# Patient Record
Sex: Male | Born: 1935 | Race: Black or African American | Hispanic: No | Marital: Married | Smoking: Former smoker
Health system: Southern US, Community
[De-identification: ages and names within clinical notes are randomized; demographics above are authoritative.]

## PROBLEM LIST (undated history)

## (undated) DIAGNOSIS — I10 Essential (primary) hypertension: Secondary | ICD-10-CM

## (undated) DIAGNOSIS — E119 Type 2 diabetes mellitus without complications: Secondary | ICD-10-CM

## (undated) HISTORY — PX: APPENDECTOMY: SHX54

---

## 2009-12-28 ENCOUNTER — Ambulatory Visit: Payer: Self-pay | Admitting: Internal Medicine

## 2011-10-06 IMAGING — US US EXTREM LOW VENOUS*L*
1 series · 18 of 24 positions shown · non-contrast
Comparison: none

REASON FOR EXAM: pain
COMMENTS:

PROCEDURE:     US  - US DOPPLER LOW EXTR LEFT  - December 28, 2009 [DATE]
RESULT:     Left lower extremity color flow duplex Doppler reveals no
evidence of deep venous thrombosis.

[Series 1: us extrem low venous*left* · 18 of 27 slices shown]
[im 1/27]
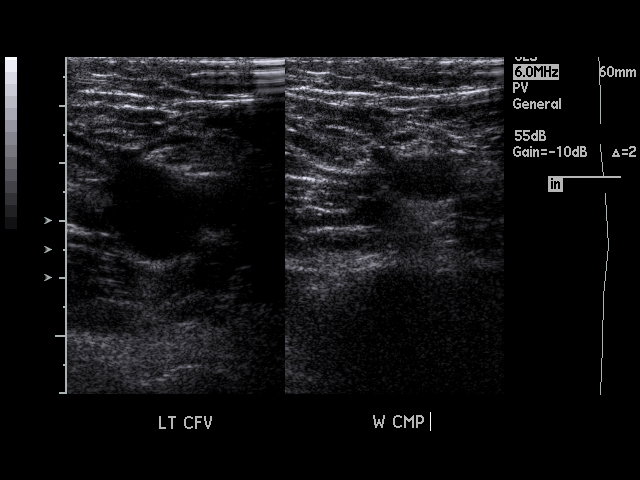
[im 3/27]
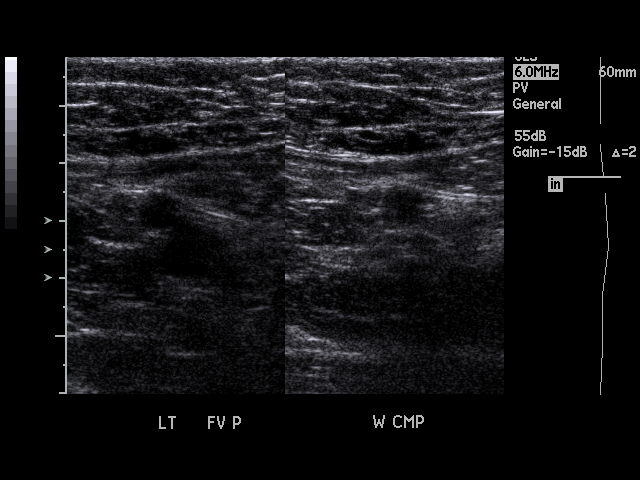
[im 4/27]
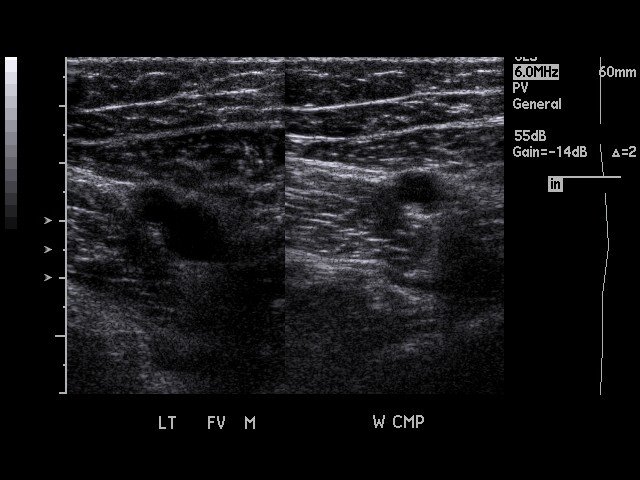
[im 5/27]
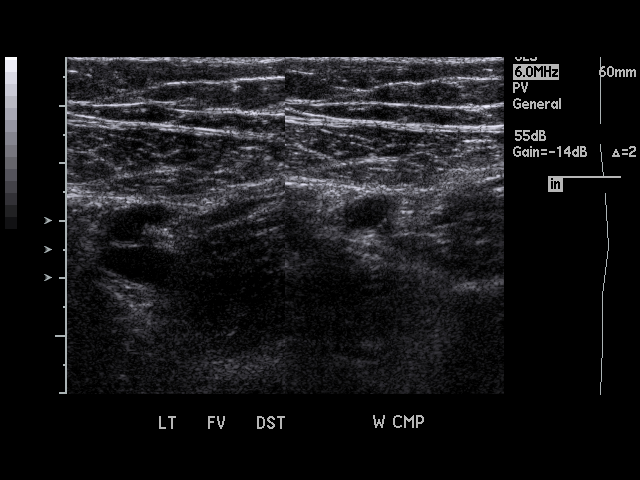
[im 7/27]
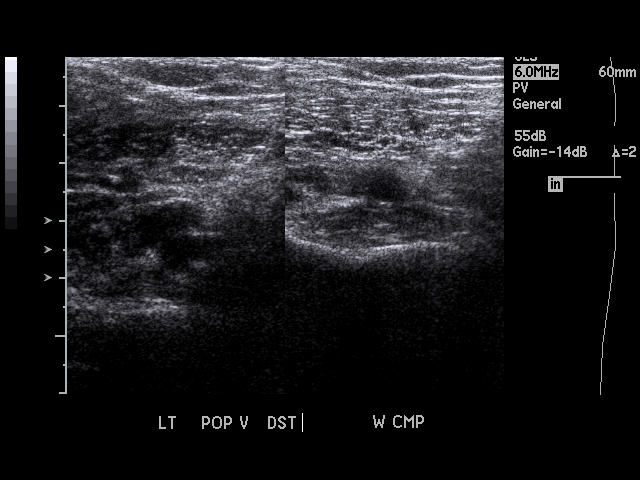
[im 8/27]
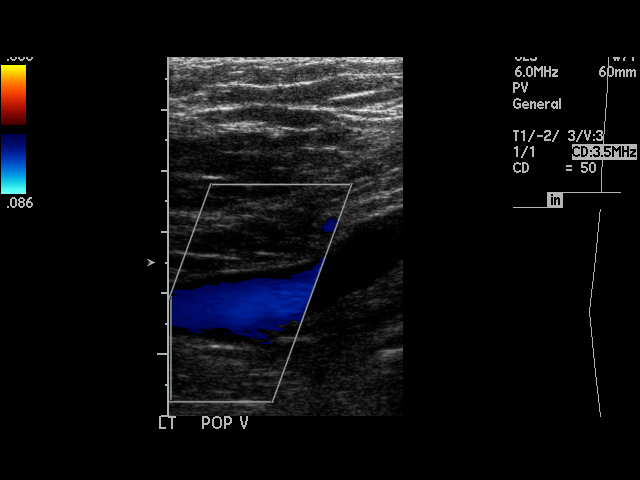
[im 10/27]
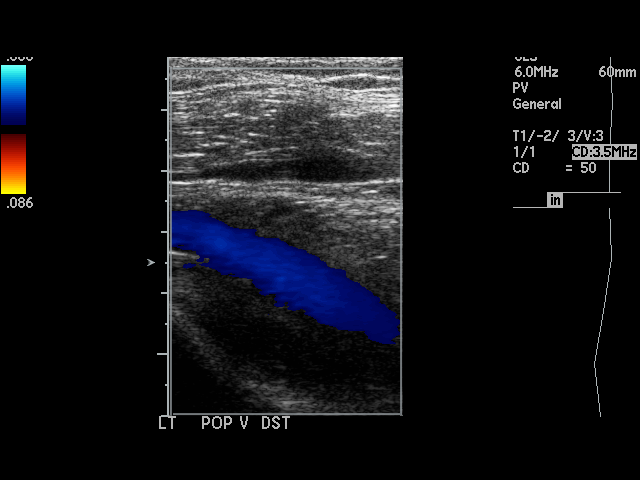
[im 12/27]
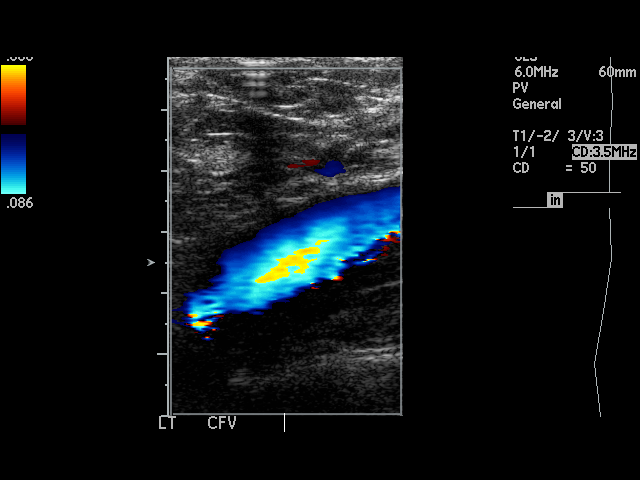
[im 13/27]
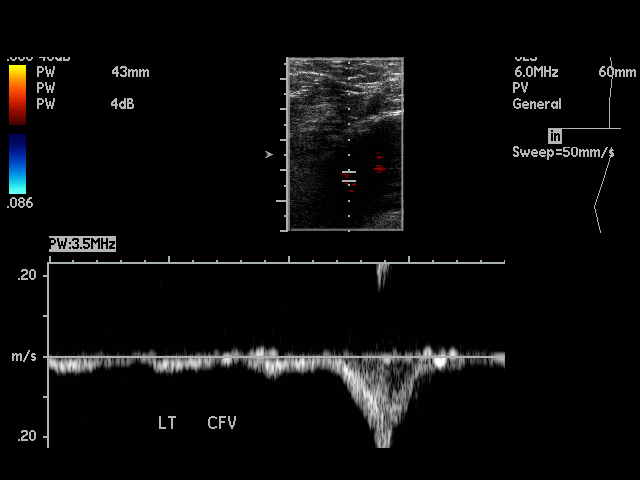
[im 14/27]
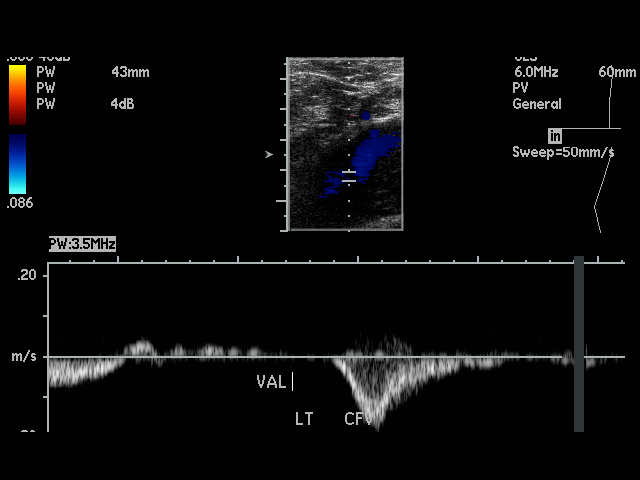
[im 16/27]
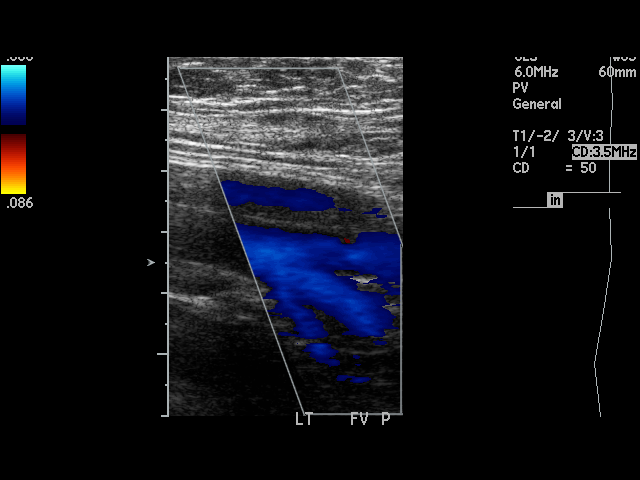
[im 17/27]
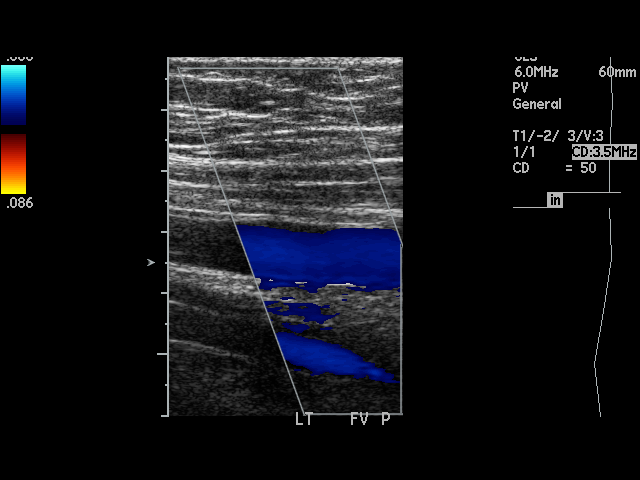
[im 19/27]
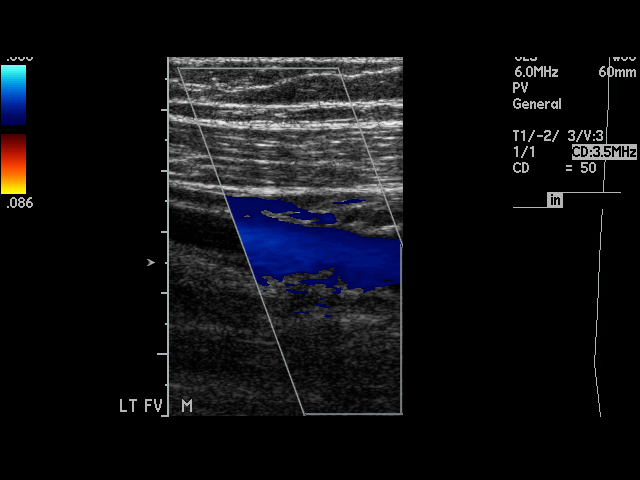
[im 21/27]
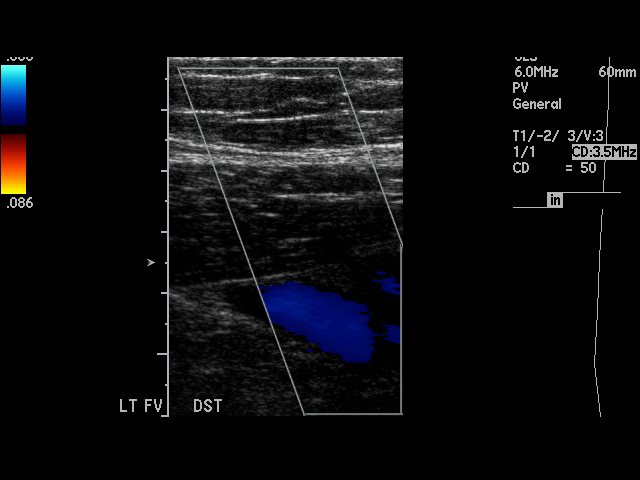
[im 22/27]
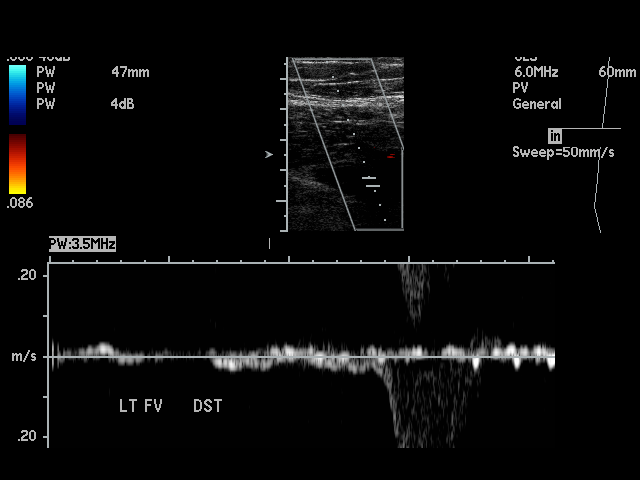
[im 23/27]
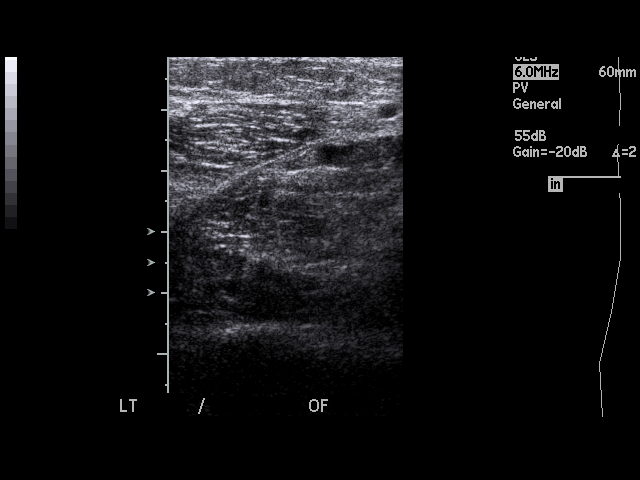
[im 25/27]
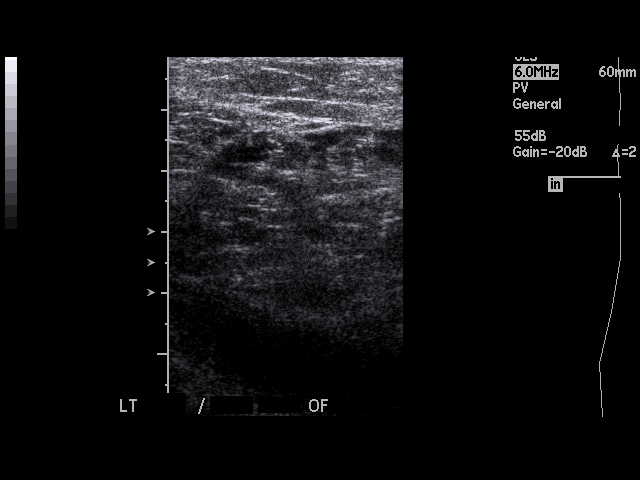
[im 27/27]
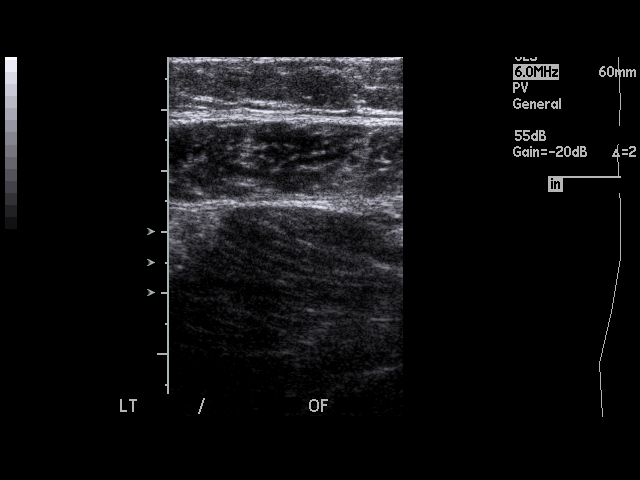

[18 of 24 positions shown; findings below may reference images not displayed]

IMPRESSION: Negative exam.

## 2011-10-14 ENCOUNTER — Ambulatory Visit: Payer: Self-pay | Admitting: Ophthalmology

## 2011-11-18 ENCOUNTER — Ambulatory Visit: Payer: Self-pay | Admitting: Ophthalmology

## 2014-12-01 ENCOUNTER — Emergency Department: Payer: Medicare Other

## 2014-12-01 ENCOUNTER — Encounter: Payer: Self-pay | Admitting: Emergency Medicine

## 2014-12-01 ENCOUNTER — Emergency Department
Admission: EM | Admit: 2014-12-01 | Discharge: 2014-12-01 | Disposition: A | Discharge status: Other Acute Inpatient Hospital | Payer: Medicare Other | Attending: Emergency Medicine | Admitting: Emergency Medicine

## 2014-12-01 DIAGNOSIS — Z7982 Long term (current) use of aspirin: Secondary | ICD-10-CM | POA: Insufficient documentation

## 2014-12-01 DIAGNOSIS — E119 Type 2 diabetes mellitus without complications: Secondary | ICD-10-CM | POA: Insufficient documentation

## 2014-12-01 DIAGNOSIS — Z87891 Personal history of nicotine dependence: Secondary | ICD-10-CM | POA: Diagnosis not present

## 2014-12-01 DIAGNOSIS — I10 Essential (primary) hypertension: Secondary | ICD-10-CM | POA: Insufficient documentation

## 2014-12-01 DIAGNOSIS — G450 Vertebro-basilar artery syndrome: Secondary | ICD-10-CM

## 2014-12-01 DIAGNOSIS — I651 Occlusion and stenosis of basilar artery: Secondary | ICD-10-CM | POA: Diagnosis not present

## 2014-12-01 DIAGNOSIS — Z79899 Other long term (current) drug therapy: Secondary | ICD-10-CM | POA: Diagnosis not present

## 2014-12-01 DIAGNOSIS — R42 Dizziness and giddiness: Secondary | ICD-10-CM | POA: Insufficient documentation

## 2014-12-01 HISTORY — DX: Type 2 diabetes mellitus without complications: E11.9

## 2014-12-01 HISTORY — DX: Essential (primary) hypertension: I10

## 2014-12-01 LAB — URINALYSIS COMPLETE WITH MICROSCOPIC (ARMC ONLY)
BACTERIA UA: NONE SEEN
Bilirubin Urine: NEGATIVE
Glucose, UA: 150 mg/dL — AB
KETONES UR: NEGATIVE mg/dL
LEUKOCYTES UA: NEGATIVE
Nitrite: NEGATIVE
PH: 7 (ref 5.0–8.0)
PROTEIN: NEGATIVE mg/dL
RBC / HPF: NONE SEEN RBC/hpf (ref 0–5)
SQUAMOUS EPITHELIAL / LPF: NONE SEEN
Specific Gravity, Urine: 1.008 (ref 1.005–1.030)

## 2014-12-01 LAB — COMPREHENSIVE METABOLIC PANEL
ALBUMIN: 4.4 g/dL (ref 3.5–5.0)
ALK PHOS: 80 U/L (ref 38–126)
ALT: 18 U/L (ref 17–63)
AST: 20 U/L (ref 15–41)
Anion gap: 8 (ref 5–15)
BUN: 11 mg/dL (ref 6–20)
CALCIUM: 9.2 mg/dL (ref 8.9–10.3)
CO2: 26 mmol/L (ref 22–32)
CREATININE: 1.04 mg/dL (ref 0.61–1.24)
Chloride: 106 mmol/L (ref 101–111)
GFR calc Af Amer: >60 mL/min (ref >60)
GFR calc non Af Amer: >60 mL/min (ref >60)
GLUCOSE: 183 mg/dL — AB (ref 65–99)
Potassium: 3.9 mmol/L (ref 3.5–5.1)
SODIUM: 140 mmol/L (ref 135–145)
Total Bilirubin: 0.5 mg/dL (ref 0.3–1.2)
Total Protein: 8.3 g/dL — ABNORMAL HIGH (ref 6.5–8.1)

## 2014-12-01 LAB — CBC WITH DIFFERENTIAL/PLATELET
BASOS PCT: 2 %
Basophils Absolute: 0.1 10*3/uL (ref 0–0.1)
EOS ABS: 0.1 10*3/uL (ref 0–0.7)
Eosinophils Relative: 3 %
HCT: 41.2 % (ref 40.0–52.0)
HEMOGLOBIN: 13.3 g/dL (ref 13.0–18.0)
LYMPHS ABS: 1.7 10*3/uL (ref 1.0–3.6)
Lymphocytes Relative: 39 %
MCH: 27.8 pg (ref 26.0–34.0)
MCHC: 32.4 g/dL (ref 32.0–36.0)
MCV: 85.8 fL (ref 80.0–100.0)
Monocytes Absolute: 0.6 10*3/uL (ref 0.2–1.0)
Monocytes Relative: 13 %
NEUTROS PCT: 43 %
Neutro Abs: 1.9 10*3/uL (ref 1.4–6.5)
Platelets: 215 10*3/uL (ref 150–440)
RBC: 4.8 MIL/uL (ref 4.40–5.90)
RDW: 12.9 % (ref 11.5–14.5)
WBC: 4.4 10*3/uL (ref 3.8–10.6)

## 2014-12-01 LAB — PROTIME-INR
INR: 0.95
Prothrombin Time: 12.9 seconds (ref 11.4–15.0)

## 2014-12-01 LAB — TROPONIN I: Troponin I: <0.03 ng/mL (ref <0.031)

## 2014-12-01 LAB — APTT: APTT: 30 s (ref 24–36)

## 2014-12-01 MED ORDER — MECLIZINE HCL 25 MG PO TABS
25.0000 mg | ORAL_TABLET | Freq: Once | ORAL | Status: AC
Start: 2014-12-01 — End: 2014-12-01
  Administered 2014-12-01: 25 mg via ORAL
  Filled 2014-12-01: qty 1

## 2014-12-01 MED ORDER — CLOPIDOGREL BISULFATE 75 MG PO TABS
75.0000 mg | ORAL_TABLET | ORAL | Status: AC
  Administered 2014-12-01: 75 mg via ORAL
  Filled 2014-12-01: qty 1

## 2014-12-01 NOTE — Consult Note (Addendum)
CC: vertigo  HPI: Jonathan Christensen is an 81 y.o. male with a history of diabetes and hypertension but with no prior history of CVA/TIA or CAD/ACS who presents with acute onset dizziness this morning. He states he awoke at the usual time early this morning and felt extremely dizzy with the sensation of the room spinning. When he is at rest it is better but when he sits up or tries to move it becomes severe.  There is no nystagmus or tinnitus.  MRI no acute abnormality. MRA significant intracranial stenosis ant and posterior circulation specifically the basilar.  Was on ASA 81 at home daily.     Past Medical History  Diagnosis Date  . Hypertension   . Diabetes mellitus without complication (HCC)     type 2    Past Surgical History  Procedure Laterality Date  . Appendectomy      No family history on file.  Social History:  reports that he has quit smoking. He does not have any smokeless tobacco history on file. He reports that he drinks alcohol. His drug history is not on file.  No Known Allergies  Medications: I have reviewed the patient's current medications.  ROS: History obtained from the patient  General ROS: negative for - chills, fatigue, fever, night sweats, weight gain or weight loss Psychological ROS: negative for - behavioral disorder, hallucinations, memory difficulties, mood swings or suicidal ideation Ophthalmic ROS: negative for - blurry vision, double vision, eye pain or loss of vision ENT ROS: negative for - epistaxis, nasal discharge, oral lesions, sore throat, tinnitus or vertigo Allergy and Immunology ROS: negative for - hives or itchy/watery eyes Hematological and Lymphatic ROS: negative for - bleeding problems, bruising or swollen lymph nodes Endocrine ROS: negative for - galactorrhea, hair pattern changes, polydipsia/polyuria or temperature intolerance Respiratory ROS: negative for - cough, hemoptysis, shortness of breath or wheezing Cardiovascular ROS:  negative for - chest pain, dyspnea on exertion, edema or irregular heartbeat Gastrointestinal ROS: negative for - abdominal pain, diarrhea, hematemesis, nausea/vomiting or stool incontinence Genito-Urinary ROS: negative for - dysuria, hematuria, incontinence or urinary frequency/urgency Musculoskeletal ROS: negative for - joint swelling or muscular weakness Neurological ROS: as noted in HPI Dermatological ROS: negative for rash and skin lesion changes  Physical Examination: Blood pressure 149/65, pulse 75, temperature 98.2 F (36.8 C), temperature source Oral, resp. rate 13, height  (1.702 m), weight 192 lb (87.091 kg), SpO2 97 %.    Neurological Examination Mental Status: Alert, oriented, thought content appropriate.  Speech fluent without evidence of aphasia.  Able to follow 3 step commands without difficulty. Cranial Nerves: II: Discs flat bilaterally; Visual fields grossly normal, pupils equal, round, reactive to light and accommodation III,IV, VI: ptosis not present, extra-ocular motions intact bilaterally V,VII: smile symmetric, facial light touch sensation normal bilaterally VIII: hearing normal bilaterally IX,X: gag reflex present XI: bilateral shoulder shrug XII: midline tongue extension Motor: Right : Upper extremity   5/5    Left:     Upper extremity   5/5  Lower extremity   5/5     Lower extremity   5/5 Tone and bulk:normal tone throughout; no atrophy noted Sensory: Pinprick and light touch intact throughout, bilaterally Deep Tendon Reflexes: 1+ and symmetric throughout Plantars: Right: downgoing   Left: downgoing Cerebellar: normal finger-to-nose, normal rapid alternating movements and normal heel-to-shin test Gait: not tested.       Laboratory Studies:   Basic Metabolic Panel:  Recent Labs Lab 12/01/14 0723  NA 140  K 3.9  CL 106  CO2 26  GLUCOSE 183*  BUN 11  CREATININE 1.04  CALCIUM 9.2    Liver Function Tests:  Recent Labs Lab  12/01/14 0723  AST 20  ALT 18  ALKPHOS 80  BILITOT 0.5  PROT 8.3*  ALBUMIN 4.4   No results for input(s): LIPASE, AMYLASE in the last 168 hours. No results for input(s): AMMONIA in the last 168 hours.  CBC:  Recent Labs Lab 12/01/14 0723  WBC 4.4  NEUTROABS 1.9  HGB 13.3  HCT 41.2  MCV 85.8  PLT 215    Cardiac Enzymes:  Recent Labs Lab 12/01/14 0723  TROPONINI <0.03    BNP: Invalid input(s): POCBNP  CBG: No results for input(s): GLUCAP in the last 168 hours.  Microbiology: No results found for this or any previous visit.  Coagulation Studies:  Recent Labs  12/01/14 0723  LABPROT 12.9  INR 0.95    Urinalysis:  Recent Labs Lab 12/01/14 1152  COLORURINE STRAW*  LABSPEC 1.008  PHURINE 7.0  GLUCOSEU 150*  HGBUR 1+*  BILIRUBINUR NEGATIVE  KETONESUR NEGATIVE  PROTEINUR NEGATIVE  NITRITE NEGATIVE  LEUKOCYTESUR NEGATIVE    Lipid Panel:  No results found for: CHOL, TRIG, HDL, CHOLHDL, VLDL, LDLCALC  HgbA1C: No results found for: HGBA1C  Urine Drug Screen:  No results found for: LABOPIA, COCAINSCRNUR, LABBENZ, AMPHETMU, THCU, LABBARB  Alcohol Level: No results for input(s): ETH in the last 168 hours.  Other results: EKG: normal EKG, normal sinus rhythm, unchanged from previous tracings.  Imaging: Mr Angiogram Head Wo Contrast  12/01/2014  CLINICAL DATA:  Patient woke up today feeling dizzy. History of hypertension. History of diabetes. EXAM: MRI HEAD WITHOUT CONTRAST MRA HEAD WITHOUT CONTRAST TECHNIQUE: Multiplanar, multiecho pulse sequences of the brain and surrounding structures were obtained without intravenous contrast. Angiographic images of the head were obtained using MRA technique without contrast. COMPARISON:  None. FINDINGS: MRI HEAD FINDINGS No acute stroke is evident. There is no visible hemorrhage, mass lesion, or extra-axial fluid. Generalized atrophy with hydrocephalus ex vacuo. Minor white matter disease, nonspecific. Flow voids  are maintained in the BILATERAL internal carotid arteries, basilar artery, and RIGHT vertebral artery. 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 This is of indeterminate age. I am unable to assess whether this is an acute finding related to the patient's acute vertigo, or is chronically occluded. Pituitary and cerebellar tonsils unremarkable. Mild spondylosis. No acute sinus or mastoid disease. Visualized orbits are negative. MRA HEAD FINDINGS RIGHT internal carotid artery: Widely patent cervical and petrous segments. 75% stenosis inferior cavernous segment. Superior cavernous, supraclinoid and ICA terminus all widely patent. LEFT internal carotid artery: Non stenotic atheromatous change at the cavernous supraclinoid junction. Otherwise widely patent. RIGHT vertebral: Widely patent. LEFT vertebral:  Thrombosed. Basilar: Severely diseased basilar, moderately irregular in its proximal, mid, and distal segments with a focal mid basilar 75% stenosis, potentially flow reducing. Anterior circulation: Severely diseased A1 origin on the RIGHT. Functionally occluded LEFT A1 ACA. Both anterior cerebral arteries fill from the RIGHT but are at risk due to the high-grade proximal stenosis. Mildly irregular RIGHT and LEFT M1 MCA. Severely diseased inferior branch LEFT M2 MCA. Mildly irregular distal M3 branches bilaterally. Posterior circulation: Severely diseased RIGHT and LEFT P1 segments of the posterior cerebral arteries, estimated 75-90% stenoses. Unremarkable BILATERAL superior cerebellar arteries. Dominant RIGHT anterior inferior cerebral artery, likely AICA/PICA trunk. With No similar findings on the LEFT. No intracranial aneurysm. IMPRESSION: No acute stroke. Generalized atrophy. 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 Potentially flow reducing  stenoses of the RIGHT internal carotid artery, RIGHT anterior cerebral artery, functionally occluded LEFT A1 ACA, both posterior cerebral arteries, and LEFT M2 MCA. Electronically Signed   By: Elsie StainJohn T Curnes M.D.   On: 12/01/2014 10:46   Mr Brain Wo Contrast  12/01/2014  CLINICAL DATA:  Patient woke up today feeling dizzy. History of hypertension. History of diabetes. EXAM: MRI HEAD WITHOUT CONTRAST MRA HEAD WITHOUT CONTRAST TECHNIQUE: Multiplanar, multiecho pulse sequences of the brain and surrounding structures were obtained without intravenous contrast. Angiographic images of the head were obtained using MRA technique without contrast. COMPARISON:  None. FINDINGS: MRI HEAD FINDINGS No acute stroke is evident. There is no visible hemorrhage, mass lesion, or extra-axial fluid. Generalized atrophy with hydrocephalus ex vacuo. Minor white matter disease, nonspecific. Flow voids are maintained in the BILATERAL internal carotid arteries, basilar artery, and RIGHT vertebral artery. 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 This is of indeterminate age. I am unable to assess whether this is an acute finding related to the patient's acute vertigo, or is chronically occluded. Pituitary and cerebellar tonsils unremarkable. Mild spondylosis. No acute sinus or mastoid disease. Visualized orbits are negative. MRA HEAD FINDINGS RIGHT internal carotid artery: Widely patent cervical and petrous segments. 75% stenosis inferior cavernous segment. Superior cavernous, supraclinoid and ICA terminus all widely patent. LEFT internal carotid artery: Non stenotic atheromatous change at the cavernous supraclinoid junction. Otherwise widely patent. RIGHT vertebral: Widely patent. LEFT vertebral:  Thrombosed. Basilar: Severely diseased basilar, moderately irregular in its proximal, mid, and distal segments with a focal mid basilar 75% stenosis, potentially flow reducing. Anterior circulation: Severely diseased A1 origin on the RIGHT. Functionally  occluded LEFT A1 ACA. Both anterior cerebral arteries fill from the RIGHT but are at risk due to the high-grade proximal stenosis. Mildly irregular RIGHT and LEFT M1 MCA. Severely diseased inferior branch LEFT M2 MCA. Mildly irregular distal M3 branches bilaterally. Posterior circulation: Severely diseased RIGHT and LEFT P1 segments of the posterior cerebral arteries, estimated 75-90% stenoses. Unremarkable BILATERAL superior cerebellar arteries. Dominant RIGHT anterior inferior cerebral artery, likely AICA/PICA trunk. With No similar findings on the LEFT. No intracranial aneurysm. IMPRESSION: No acute stroke. Generalized atrophy. 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 Potentially flow reducing stenoses of the RIGHT internal carotid artery, RIGHT anterior cerebral artery, functionally occluded LEFT A1 ACA, both posterior cerebral arteries, and LEFT M2 MCA. Electronically Signed   By: Elsie StainJohn T Curnes M.D.   On: 12/01/2014 10:46     Assessment/Plan:  81 y.o. male with a history of diabetes and hypertension but with no prior history of CVA/TIA or CAD/ACS who presents with acute onset dizziness this morning. He states he awoke at the usual time early this morning and felt extremely dizzy with the sensation of the room spinning. When he is at rest it is better but when he sits up or tries to move it becomes severe.  There is no nystagmus or tinnitus.  MRI no acute abnormality. MRA significant intracranial stenosis ant and posterior circulation specifically the basilar.  Was on ASA 81 at home daily.      - [facility] - Dr. Lanna PocheHeck to follow up  on the patient - started on plavix 75, he is on dual anti platelet therapy now - Keep SBP >140 - Transfer center called in regard to the transfer   12/01/2014, 2:06 PM

## 2014-12-01 NOTE — ED Notes (Signed)
Pt states he woke up this am with feeling dizzy like the room was spinning, was able to walk with EMS into ambulance with no problems. Alert and oriented. NIH 0.

## 2014-12-01 NOTE — ED Provider Notes (Signed)
[facility] Emergency Department Provider Note  ____________________________________________  Time seen: Approximately 7:24 AM  I have reviewed the triage vital signs and the nursing notes.   HISTORY  Chief Complaint Dizziness    HPI Jonathan Christensen is a 81 y.o. male with a history of diabetes and hypertension but with no prior history of CVA/TIA or CAD/ACS who presents with acute onset dizziness this morning.  He states he awoke at the usual time early this morning and felt extremely dizzy with the sensation of the room spinning.  When he is at rest it is better but when he sits up or tries to move it becomes severe.  He waited a period of time to see if it would resolve but it did not so he called EMS.  EMS notes that he was ambulatory to the ambulance without any difficulties.  He denies nausea or vomiting and states he is starving.  He has no pain.  He complains of no numbness or weakness in any of his extremities.  He feels mildly vertiginous at rest in bed but states it become severe when he sits up.   Past Medical History  Diagnosis Date  . Hypertension   . Diabetes mellitus without complication (HCC)     type 2    There are no active problems to display for this patient.   Past Surgical History  Procedure Laterality Date  . Appendectomy      Current Outpatient Rx  Name  Route  Sig  Dispense  Refill  . amLODipine-benazepril (LOTREL) 10-20 MG capsule   Oral   Take 1 capsule by mouth daily.         Marland Kitchen. aspirin EC 81 MG tablet   Oral   Take 81 mg by mouth daily.         Marland Kitchen. atorvastatin (LIPITOR) 10 MG tablet   Oral   Take 10 mg by mouth every morning.         . metFORMIN (GLUCOPHAGE) 500 MG tablet   Oral   Take 500 mg by mouth daily. Take with food.         . metoprolol succinate (TOPROL-XL) 100 MG 24 hr tablet   Oral   Take 100 mg by mouth daily.         . mometasone (NASONEX) 50 MCG/ACT nasal spray   Nasal   Place 2 sprays  into the nose daily as needed. For allergies.           Allergies Review of patient's allergies indicates no known allergies.  No family history on file.  Social History Social History  Substance Use Topics  . Smoking status: Former Games developermoker  . Smokeless tobacco: None  . Alcohol Use: Yes     Comment: occasionally    Review of Systems Constitutional: No fever/chills Eyes: No visual changes. ENT: No sore throat. Cardiovascular: Denies chest pain. Respiratory: Denies shortness of breath. Gastrointestinal: No abdominal pain.  No nausea, no vomiting.  No diarrhea.  No constipation. Genitourinary: Negative for dysuria. Musculoskeletal: Negative for back pain. Skin: Negative for rash. Neurological: Negative for headaches, focal weakness or numbness.  Vertigo upon change of position.  10-point ROS otherwise negative.  ____________________________________________   PHYSICAL EXAM:  ED Triage Vitals  Enc Vitals Group     BP 12/01/14 0727 170/81 mmHg     Pulse Rate 12/01/14 0727 74     Resp 12/01/14 0727 16     Temp 12/01/14 0727 98.2 F (36.8 C)  Temp Source 12/01/14 0727 Oral     SpO2 12/01/14 0727 97 %     Weight 12/01/14 0727 192 lb (87.091 kg)     Height 12/01/14 0727  (1.702 m)     Head Cir --      Peak Flow --      Pain Score --      Pain Loc --      Pain Edu? --      Excl. in GC? --       Constitutional: Alert and oriented. Well appearing and in no acute distress. Eyes: Conjunctivae are normal. PERRL. EOMI. slow beating horizontal nystagmus at the extremes of visual fields, more notable when looking to the far right. Head: Atraumatic.  Ear canals are clean, nonerythematous, and TMs are normal in appearance with no evidence of effusions. Nose: No congestion/rhinnorhea. Mouth/Throat: Mucous membranes are moist.  Oropharynx non-erythematous. Neck: No stridor.   Cardiovascular: Normal rate, regular rhythm. Grossly normal heart sounds.  Good peripheral  circulation. Respiratory: Normal respiratory effort.  No retractions. Lungs CTAB. Gastrointestinal: Soft and nontender. No distention. No abdominal bruits. No CVA tenderness. Musculoskeletal: No lower extremity tenderness nor edema.  No joint effusions. Neurologic:  Normal speech and language. No gross focal neurologic deficits are appreciated.  Romberg is negative.  There is no dysmetria on finger to nose test.  Strength is intact throughout extremities as is sensation.  There is no pronator drift. Skin:  Skin is warm, dry and intact. No rash noted. Psychiatric: Mood and affect are normal. Speech and behavior are normal.  ____________________________________________   LABS (all labs ordered are listed, but only abnormal results are displayed)  Labs Reviewed  COMPREHENSIVE METABOLIC PANEL - Abnormal; Notable for the following:    Glucose, Bld 183 (*)    Total Protein 8.3 (*)    All other components within normal limits  URINALYSIS COMPLETEWITH MICROSCOPIC ([facility] ONLY) - Abnormal; Notable for the following:    Color, Urine STRAW (*)    APPearance CLEAR (*)    Glucose, UA 150 (*)    Hgb urine dipstick 1+ (*)    All other components within normal limits  CBC WITH DIFFERENTIAL/PLATELET  PROTIME-INR  APTT  TROPONIN I   ____________________________________________  EKG  ED ECG REPORT I, Marshall Roehrich, the attending physician, personally viewed and interpreted this ECG.  Date: 12/01/2014 EKG Time: 07:20 Rate: 78 Rhythm: normal sinus rhythm QRS Axis: normal Intervals: normal ST/T Wave abnormalities: normal Conduction Disutrbances: none Narrative Interpretation: unremarkable  ____________________________________________  RADIOLOGY   Mr Angiogram Head Wo Contrast  12/01/2014  CLINICAL DATA:  Patient woke up today feeling dizzy. History of hypertension. History of diabetes. EXAM: MRI HEAD WITHOUT CONTRAST MRA HEAD WITHOUT CONTRAST TECHNIQUE: Multiplanar, multiecho pulse  sequences of the brain and surrounding structures were obtained without intravenous contrast. Angiographic images of the head were obtained using MRA technique without contrast. COMPARISON:  None. FINDINGS: MRI HEAD FINDINGS No acute stroke is evident. There is no visible hemorrhage, mass lesion, or extra-axial fluid. Generalized atrophy with hydrocephalus ex vacuo. Minor white matter disease, nonspecific. Flow voids are maintained in the BILATERAL internal carotid arteries, basilar artery, and RIGHT vertebral artery. 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 This is of indeterminate age. I am unable to assess whether this is an acute finding related to the patient's acute vertigo, or is chronically occluded. Pituitary and cerebellar tonsils unremarkable. Mild spondylosis. No acute sinus or mastoid disease. Visualized orbits are negative. MRA HEAD FINDINGS RIGHT internal carotid  artery: Widely patent cervical and petrous segments. 75% stenosis inferior cavernous segment. Superior cavernous, supraclinoid and ICA terminus all widely patent. LEFT internal carotid artery: Non stenotic atheromatous change at the cavernous supraclinoid junction. Otherwise widely patent. RIGHT vertebral: Widely patent. LEFT vertebral:  Thrombosed. Basilar: Severely diseased basilar, moderately irregular in its proximal, mid, and distal segments with a focal mid basilar 75% stenosis, potentially flow reducing. Anterior circulation: Severely diseased A1 origin on the RIGHT. Functionally occluded LEFT A1 ACA. Both anterior cerebral arteries fill from the RIGHT but are at risk due to the high-grade proximal stenosis. Mildly irregular RIGHT and LEFT M1 MCA. Severely diseased inferior branch LEFT M2 MCA. Mildly irregular distal M3 branches bilaterally. Posterior circulation: Severely diseased RIGHT and LEFT P1 segments of the posterior cerebral arteries, estimated 75-90% stenoses. Unremarkable BILATERAL superior cerebellar arteries. Dominant RIGHT  anterior inferior cerebral artery, likely AICA/PICA trunk. With No similar findings on the LEFT. No intracranial aneurysm. IMPRESSION: No acute stroke. Generalized atrophy. 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 Potentially flow reducing stenoses of the RIGHT internal carotid artery, RIGHT anterior cerebral artery, functionally occluded LEFT A1 ACA, both posterior cerebral arteries, and LEFT M2 MCA. Electronically Signed   By: Elsie Stain M.D.   On: 12/01/2014 10:46   Mr Brain Wo Contrast  12/01/2014  CLINICAL DATA:  Patient woke up today feeling dizzy. History of hypertension. History of diabetes. EXAM: MRI HEAD WITHOUT CONTRAST MRA HEAD WITHOUT CONTRAST TECHNIQUE: Multiplanar, multiecho pulse sequences of the brain and surrounding structures were obtained without intravenous contrast. Angiographic images of the head were obtained using MRA technique without contrast. COMPARISON:  None. FINDINGS: MRI HEAD FINDINGS No acute stroke is evident. There is no visible hemorrhage, mass lesion, or extra-axial fluid. Generalized atrophy with hydrocephalus ex vacuo. Minor white matter disease, nonspecific. Flow voids are maintained in the BILATERAL internal carotid arteries, basilar artery, and RIGHT vertebral artery. 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 This is of indeterminate age. I am unable to assess whether this is an acute finding related to the patient's acute vertigo, or is chronically occluded. Pituitary and cerebellar tonsils unremarkable. Mild spondylosis. No acute sinus or mastoid disease. Visualized orbits are negative. MRA HEAD FINDINGS RIGHT internal carotid artery: Widely patent cervical and petrous segments. 75% stenosis inferior cavernous segment. Superior cavernous, supraclinoid and ICA terminus all widely patent.  LEFT internal carotid artery: Non stenotic atheromatous change at the cavernous supraclinoid junction. Otherwise widely patent. RIGHT vertebral: Widely patent. LEFT vertebral:  Thrombosed. Basilar: Severely diseased basilar, moderately irregular in its proximal, mid, and distal segments with a focal mid basilar 75% stenosis, potentially flow reducing. Anterior circulation: Severely diseased A1 origin on the RIGHT. Functionally occluded LEFT A1 ACA. Both anterior cerebral arteries fill from the RIGHT but are at risk due to the high-grade proximal stenosis. Mildly irregular RIGHT and LEFT M1 MCA. Severely diseased inferior branch LEFT M2 MCA. Mildly irregular distal M3 branches bilaterally. Posterior circulation: Severely diseased RIGHT and LEFT P1 segments of the posterior cerebral arteries, estimated 75-90% stenoses. Unremarkable BILATERAL superior cerebellar arteries. Dominant RIGHT anterior inferior cerebral artery, likely AICA/PICA trunk. With No similar findings on the LEFT. No intracranial aneurysm. IMPRESSION: No acute stroke. Generalized atrophy. 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 Severely diseased basilar artery with potentially flow  reducing 75% mid basilar stenosis. Potentially flow reducing stenoses of the RIGHT internal carotid artery, RIGHT anterior cerebral artery, functionally occluded LEFT A1 ACA, both posterior cerebral arteries, and LEFT M2 MCA. Electronically Signed   By: Elsie Stain M.D.   On: 12/01/2014 10:46    ____________________________________________   PROCEDURES  Procedure(s) performed: None  Critical Care performed: No ____________________________________________   INITIAL IMPRESSION / ASSESSMENT AND PLAN / ED COURSE  Pertinent labs & imaging results that were available during my care of the patient were reviewed by me and considered in my medical decision making (see  chart for details).  Considering central versus peripheral vertigo.  Overall the patient is very well-appearing with relatively mild symptoms.  Ironically that may be more consistent with a central cause rather than the dramatic vertigo and vomiting seen typically with peripheral etiology.  I will evaluate further, empirically treated with meclizine, and consider imaging  ----------------------------------------- 8:52 AM on 12/01/2014 -----------------------------------------  The patient's symptoms did not improve with meclizine.  Given his risk factors, the acute onset of his symptoms, and my concern for a central cause, I will further evaluate with MRI head without contrast and MRA brain without contrast.  I verified that these are the appropriate and sufficient studies to order by calling and speaking by phone with the radiologist on-call at [facility].  The patient and his wife agree with this plan.  (Note that documentation was delayed due to multiple ED patients requiring immediate care.)   ----------------------------------------- 1:59 PM on 12/01/2014 -----------------------------------------  I spoke with phone with the on-call vascular surgeon, Dr. Evie Lacks, who confirmed that they do not perform this type of procedure.  I spoke again with Dr. Loretha Brasil who had time to consider the patient's symptoms and the extent of disease on his MRI.  He is very concerned about the patient and his possibility of having a large cerebellar CVA.  Dr. Loretha Brasil came to [facility] and evaluated the patient in person in the emergency department.  He confirmed that he feels the patient needs to be transferred to [facility] to the neuro ICU for further evaluation, monitoring, and possibly interventional radiology.  Dr. Loretha Brasil stated that because he manages that neuro ICU, he will handle the transfer and admission at [facility].  The patient and the family are up-to-date with the  plan. ____________________________________________  FINAL CLINICAL IMPRESSION(S) / ED DIAGNOSES  Final diagnoses:  Vertigo  Basilar artery stenosis, symptomatic, without infarction      NEW MEDICATIONS STARTED DURING THIS VISIT:  New Prescriptions   No medications on file     Loleta Rose, MD 12/01/14 1413

## 2016-06-10 ENCOUNTER — Other Ambulatory Visit (INDEPENDENT_AMBULATORY_CARE_PROVIDER_SITE_OTHER): Payer: Self-pay | Admitting: Vascular Surgery

## 2016-06-10 DIAGNOSIS — I6523 Occlusion and stenosis of bilateral carotid arteries: Secondary | ICD-10-CM

## 2016-06-11 ENCOUNTER — Ambulatory Visit (INDEPENDENT_AMBULATORY_CARE_PROVIDER_SITE_OTHER): Payer: Medicare Other | Admitting: Vascular Surgery

## 2016-06-11 ENCOUNTER — Ambulatory Visit (INDEPENDENT_AMBULATORY_CARE_PROVIDER_SITE_OTHER): Payer: Medicare Other

## 2016-06-11 ENCOUNTER — Encounter (INDEPENDENT_AMBULATORY_CARE_PROVIDER_SITE_OTHER): Payer: Self-pay | Admitting: Vascular Surgery

## 2016-06-11 DIAGNOSIS — E119 Type 2 diabetes mellitus without complications: Secondary | ICD-10-CM | POA: Insufficient documentation

## 2016-06-11 DIAGNOSIS — I10 Essential (primary) hypertension: Secondary | ICD-10-CM | POA: Insufficient documentation

## 2016-06-11 DIAGNOSIS — E785 Hyperlipidemia, unspecified: Secondary | ICD-10-CM | POA: Insufficient documentation

## 2016-06-11 DIAGNOSIS — E1159 Type 2 diabetes mellitus with other circulatory complications: Secondary | ICD-10-CM | POA: Diagnosis not present

## 2016-06-11 DIAGNOSIS — I6523 Occlusion and stenosis of bilateral carotid arteries: Secondary | ICD-10-CM

## 2016-06-11 DIAGNOSIS — E782 Mixed hyperlipidemia: Secondary | ICD-10-CM

## 2016-06-11 DIAGNOSIS — I6529 Occlusion and stenosis of unspecified carotid artery: Secondary | ICD-10-CM | POA: Insufficient documentation

## 2016-06-11 NOTE — Progress Notes (Signed)
MRN : [id number]  Jonathan Christensen is a 83 y.o. ([birth year]) male who presents with chief complaint of  Chief Complaint  Patient presents with  . Follow-up  .  History of Present Illness: The patient is seen for follow up evaluation of carotid stenosis. The carotid stenosis followed by ultrasound.   The patient denies amaurosis fugax. There is no recent history of TIA symptoms or focal motor deficits. There is no prior documented CVA.  The patient is taking enteric-coated aspirin 81 mg daily.  There is no history of migraine headaches. There is no history of seizures.  The patient has a history of coronary artery disease, no recent episodes of angina or shortness of breath. The patient denies PAD or claudication symptoms. There is a history of hyperlipidemia which is being treated with a statin.    Carotid Duplex done today shows <50%.  No change compared to last study   Current Meds  Medication Sig  . amLODipine-benazepril (LOTREL) 10-20 MG capsule   . aspirin EC 81 MG tablet Take 81 mg by mouth daily.  Marland Kitchen atorvastatin (LIPITOR) 10 MG tablet   . metFORMIN (GLUCOPHAGE) 500 MG tablet   . metoprolol succinate (TOPROL-XL) 100 MG 24 hr tablet Take 100 mg by mouth daily.  . mometasone (NASONEX) 50 MCG/ACT nasal spray Place 2 sprays into the nose daily as needed. For allergies.    Past Medical History:  Diagnosis Date  . Diabetes mellitus without complication (HCC)    type 2  . Hypertension     Past Surgical History:  Procedure Laterality Date  . APPENDECTOMY      Social History Social History  Substance Use Topics  . Smoking status: Former Games developer  . Smokeless tobacco: Never Used  . Alcohol use Yes     Comment: occasionally    Family History History reviewed. No pertinent family history.  No Known Allergies   REVIEW OF SYSTEMS (Negative unless checked)  Constitutional: [] Weight loss  [] Fever  [] Chills Cardiac: [] Chest pain   [] Chest pressure   [] Palpitations    [] Shortness of breath when laying flat   [] Shortness of breath with exertion. Vascular:  [] Pain in legs with walking   [] Pain in legs at rest  [] History of DVT   [] Phlebitis   [] Swelling in legs   [] Varicose veins   [] Non-healing ulcers Pulmonary:   [] Uses home oxygen   [] Productive cough   [] Hemoptysis   [] Wheeze  [] COPD   [] Asthma Neurologic:  [] Dizziness   [] Seizures   [] History of stroke   [] History of TIA  [] Aphasia   [] Vissual changes   [] Weakness or numbness in arm   [] Weakness or numbness in leg Musculoskeletal:   [] Joint swelling   [] Joint pain   [] Low back pain Hematologic:  [] Easy bruising  [] Easy bleeding   [] Hypercoagulable state   [] Anemic Gastrointestinal:  [] Diarrhea   [] Vomiting  [] Gastroesophageal reflux/heartburn   [] Difficulty swallowing. Genitourinary:  [] Chronic kidney disease   [] Difficult urination  [] Frequent urination   [] Blood in urine Skin:  [] Rashes   [] Ulcers  Psychological:  [] History of anxiety   []  History of major depression.  Physical Examination  Vitals:   06/11/16 1442  BP: (!) 196/82  Pulse: 68  Resp: 16  Weight: 84.8 kg (187 lb)   Body mass index is 29.29 kg/m. Gen: WD/WN, NAD Head: /AT, No temporalis wasting.  Ear/Nose/Throat: Hearing grossly intact, nares w/o erythema or drainage Eyes: PER, EOMI, sclera nonicteric.  Neck: Supple, no large masses.  Pulmonary:  Good air movement, no audible wheezing bilaterally, no use of accessory muscles.  Cardiac: RRR, no JVD Vascular: bilateral carotid bruits Vessel Right Left  Radial Palpable Palpable  Ulnar Palpable Palpable  Brachial Palpable Palpable  Carotid Palpable Palpable  Gastrointestinal: Non-distended. No guarding/no peritoneal signs.  Musculoskeletal: M/S 5/5 throughout.  No deformity or atrophy.  Neurologic: CN 2-12 intact. Symmetrical.  Speech is fluent. Motor exam as listed above. Psychiatric: Judgment intact, Mood & affect appropriate for pt's clinical situation. Dermatologic: No  rashes or ulcers noted.  No changes consistent with cellulitis. Lymph : No lichenification or skin changes of chronic lymphedema.  CBC Lab Results  Component Value Date   WBC 4.4 12/01/2014   HGB 13.3 12/01/2014   HCT 41.2 12/01/2014   MCV 85.8 12/01/2014   PLT 215 12/01/2014    BMET    Component Value Date/Time   NA 140 12/01/2014 0723   K 3.9 12/01/2014 0723   CL 106 12/01/2014 0723   CO2 26 12/01/2014 0723   GLUCOSE 183 (H) 12/01/2014 0723   BUN 11 12/01/2014 0723   CREATININE 1.04 12/01/2014 0723   CALCIUM 9.2 12/01/2014 0723   GFRNONAA >60 12/01/2014 0723   GFRAA >60 12/01/2014 0723   CrCl cannot be calculated (Patient's most recent lab result is older than the maximum 21 days allowed.).  COAG Lab Results  Component Value Date   INR 0.95 12/01/2014    Radiology No results found.  Assessment/Plan 1. Bilateral carotid artery stenosis Recommend:  Given the patient's asymptomatic subcritical stenosis no further invasive testing or surgery at this time.  Duplex ultrasound shows <30% stenosis bilaterally.  Continue antiplatelet therapy as prescribed Continue management of CAD, HTN and Hyperlipidemia Healthy heart diet,  encouraged exercise at least 4 times per week Follow up PRN based on the minimal plaque formation fo the carotid arteries  2. Essential hypertension Continue antihypertensive medications as already ordered, these medications have been reviewed and there are no changes at this time.   3. Mixed hyperlipidemia Continue statin as ordered and reviewed, no changes at this time   4. Type 2 diabetes mellitus with other circulatory complication, without long-term current use of insulin (HCC) Continue hypoglycemic medications as already ordered, these medications have been reviewed and there are no changes at this time.  Hgb A1C to be monitored as already arranged by primary service     Levora DredgeGregory Schnier, MD  06/11/2016 3:02 PM

## 2016-09-08 IMAGING — MR MR HEAD W/O CM
1 series · 24 of 48 positions shown · non-contrast
Comparison: None.

CLINICAL DATA: Patient woke up today feeling dizzy. History of
hypertension. History of diabetes.

EXAM:
MRI HEAD WITHOUT CONTRAST
MRA HEAD WITHOUT CONTRAST
TECHNIQUE: Multiplanar, multiecho pulse sequences of the brain and surrounding
structures were obtained without intravenous contrast. Angiographic
images of the head were obtained using MRA technique without
contrast.

[Series 2: TOF · axial · 0.8mm · 0.39mm/px · z∈[-70,+38]mm · 24 of 144 slices shown]
[im 1/144]
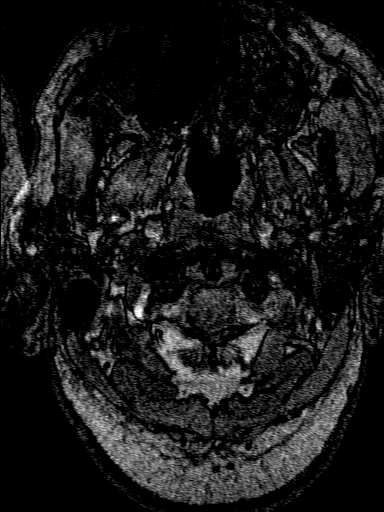
[im 4/144]
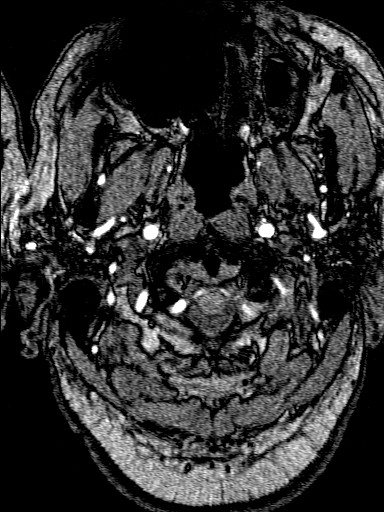
[im 7/144]
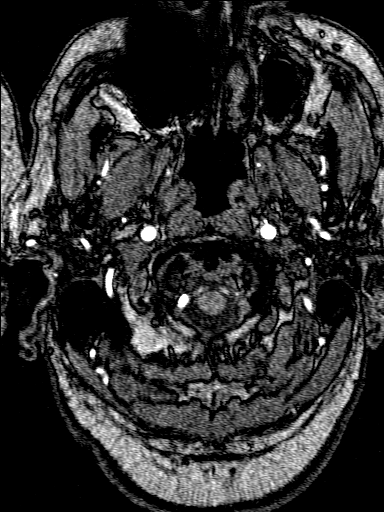
[im 10/144]
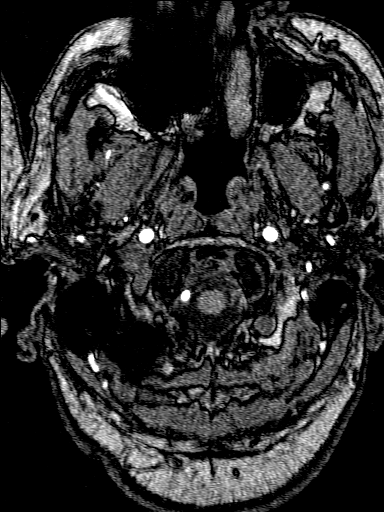
[im 13/144]
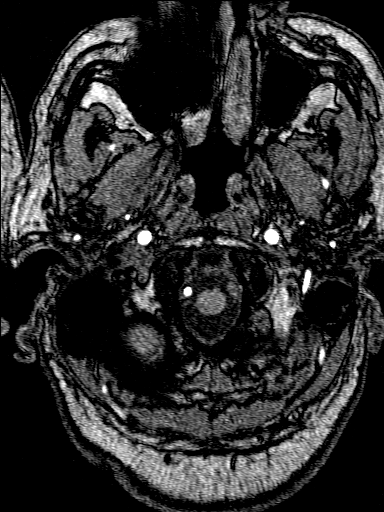
[im 16/144]
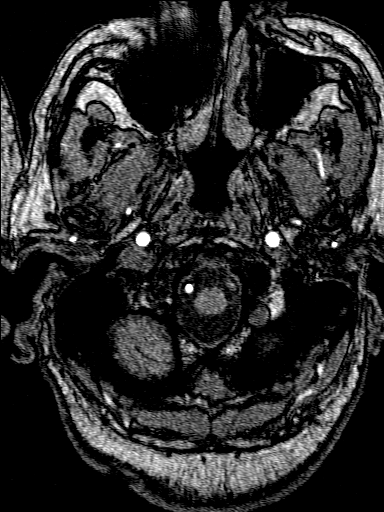
[im 19/144]
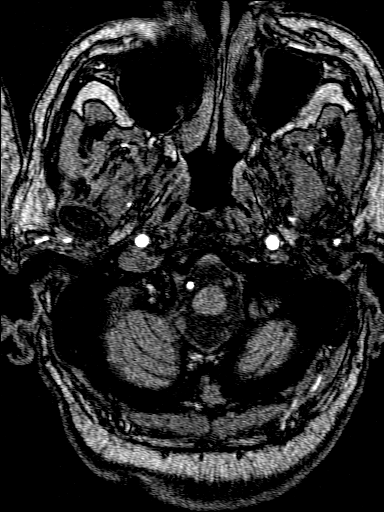
[im 22/144]
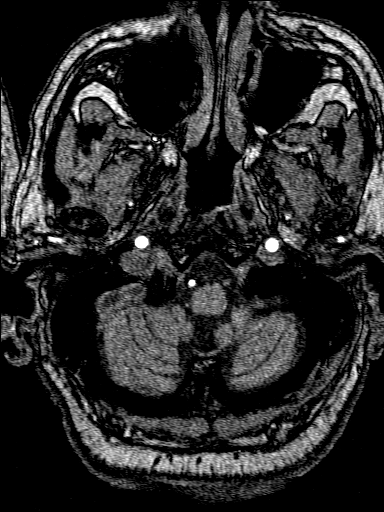
[im 25/144]
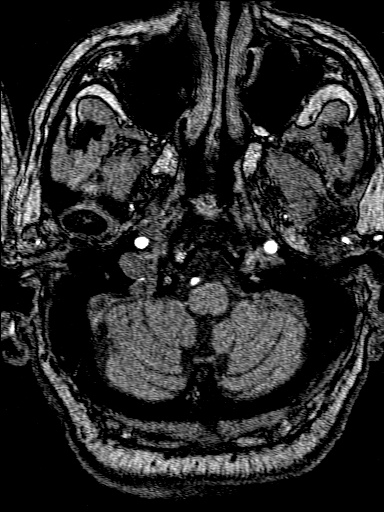
[im 28/144]
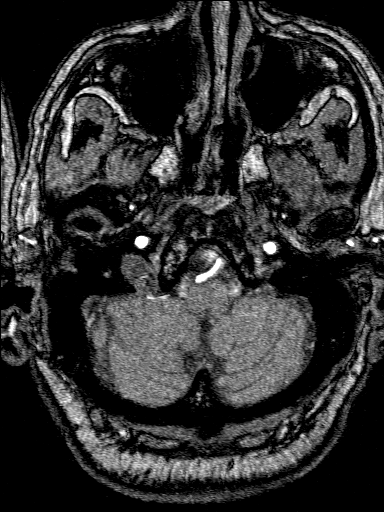
[im 31/144]
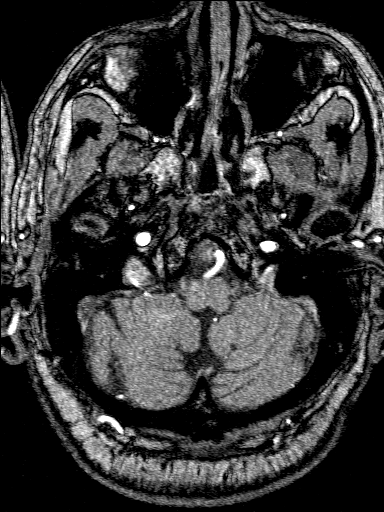
[im 34/144]
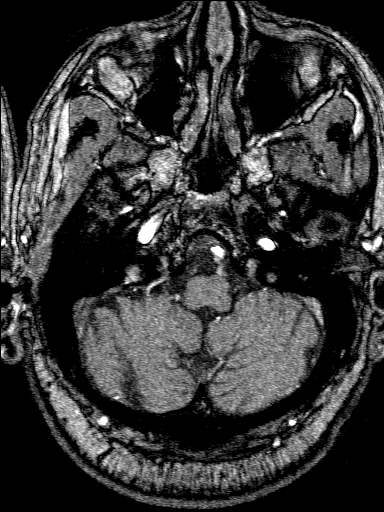
[im 37/144]
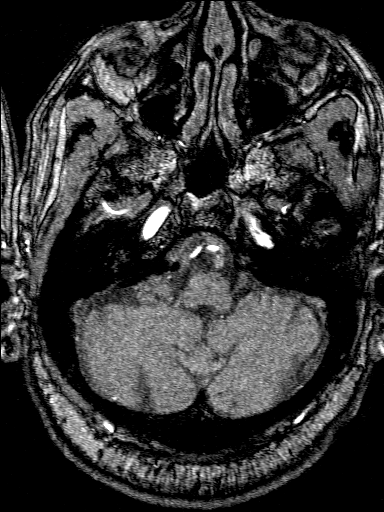
[im 40/144]
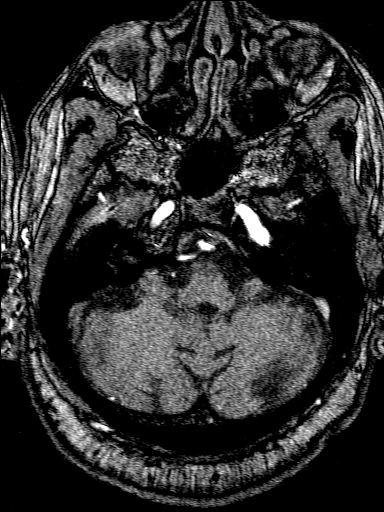
[im 43/144]
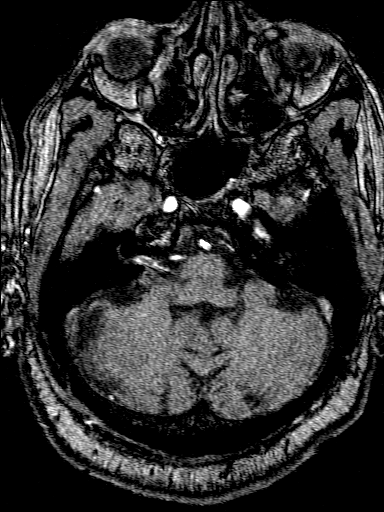
[im 46/144]
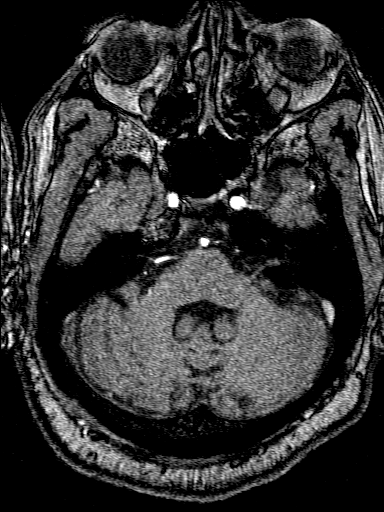
[im 49/144]
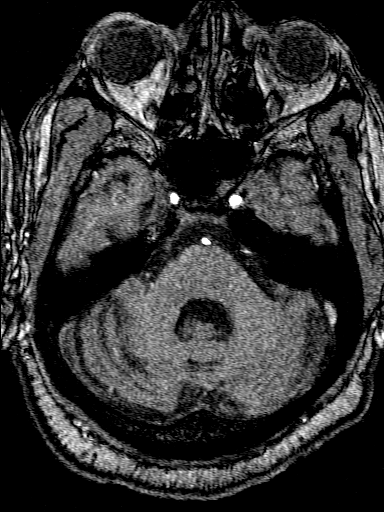
[im 64/144]
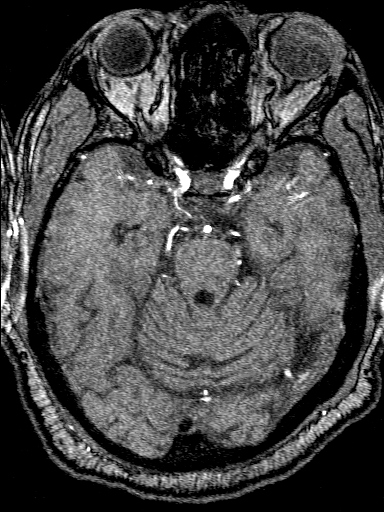
[im 74/144]
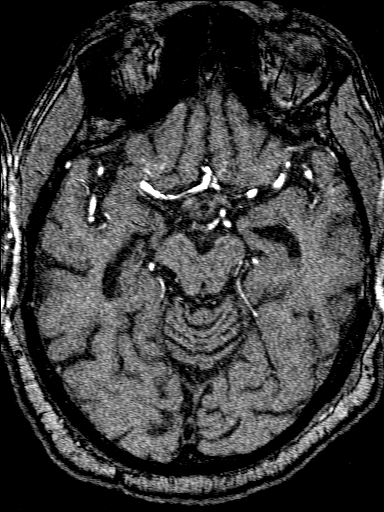
[im 83/144]
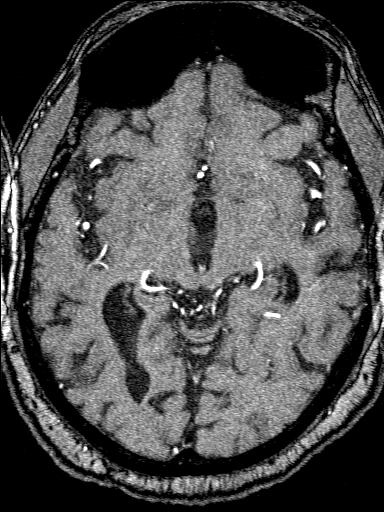
[im 101/144]
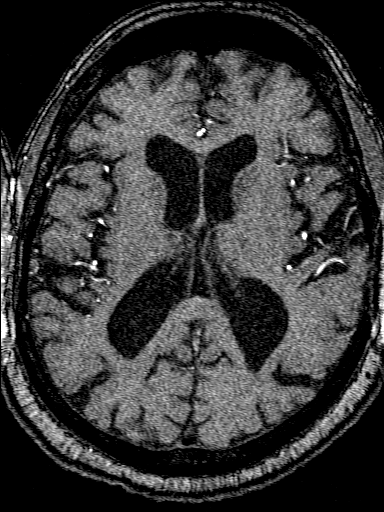
[im 119/144]
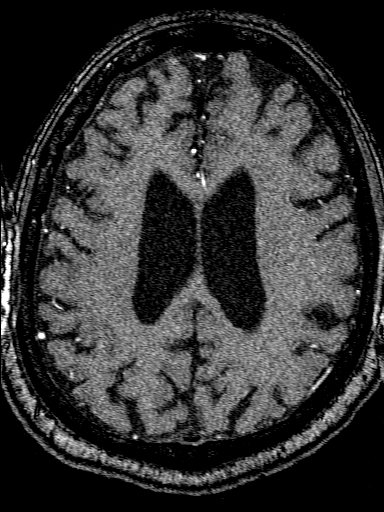
[im 122/144]
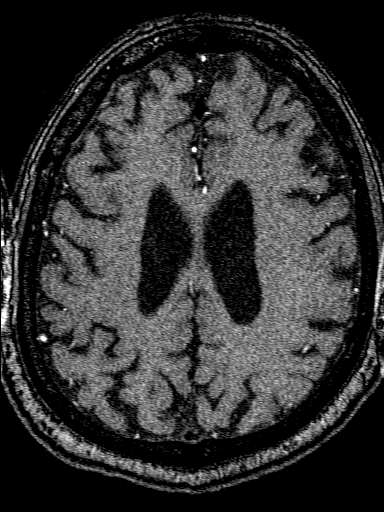
[im 137/144]
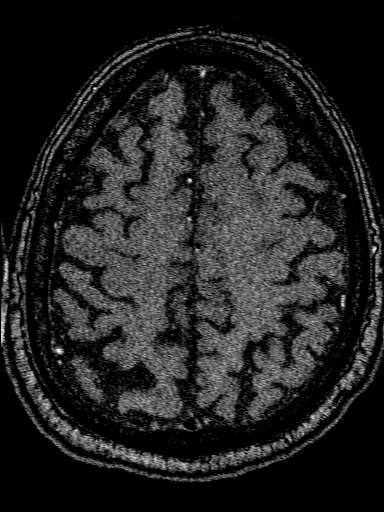

[24 of 48 positions shown; findings below may reference images not displayed]

FINDINGS: MRI HEAD FINDINGS

No acute stroke is evident.

There is no visible hemorrhage, mass lesion, or extra-axial fluid.
Generalized atrophy with hydrocephalus ex vacuo. Minor white matter
disease, nonspecific.

Flow voids are maintained in the BILATERAL internal carotid
arteries, basilar artery, and RIGHT vertebral artery. The LEFT
vertebral is thrombosed. This is of indeterminate age. I am unable
to assess whether this is an acute finding related to the patient's
acute vertigo, or is chronically occluded.

Pituitary and cerebellar tonsils unremarkable. Mild spondylosis. No
acute sinus or mastoid disease. Visualized orbits are negative.

MRA HEAD FINDINGS

RIGHT internal carotid artery: Widely patent cervical and petrous
segments. 75% stenosis inferior cavernous segment. Superior
cavernous, supraclinoid and ICA terminus all widely patent.

LEFT internal carotid artery: Non stenotic atheromatous change at
the cavernous supraclinoid junction. Otherwise widely patent.

RIGHT vertebral: Widely patent.

LEFT vertebral:  Thrombosed.

Basilar: Severely diseased basilar, moderately irregular in its
proximal, mid, and distal segments with a focal mid basilar 75%
stenosis, potentially flow reducing.

Anterior circulation: Severely diseased A1 origin on the RIGHT.
Functionally occluded LEFT A1 ACA. Both anterior cerebral arteries
fill from the RIGHT but are at risk due to the high-grade proximal
stenosis. Mildly irregular RIGHT and LEFT M1 MCA. Severely diseased
inferior branch LEFT M2 MCA. Mildly irregular distal M3 branches
bilaterally.

Posterior circulation: Severely diseased RIGHT and LEFT P1 segments
of the posterior cerebral arteries, estimated 75-90% stenoses.
Unremarkable BILATERAL superior cerebellar arteries. Dominant RIGHT
anterior inferior cerebral artery, likely AICA/PICA trunk. With No
similar findings on the LEFT.

No intracranial aneurysm.
IMPRESSION: No acute stroke.

Generalized atrophy.

Age indeterminate LEFT vertebral thrombosis; this could be acute or
chronic but there is no acute posterior circulation infarct
observed.

Severely diseased basilar artery with potentially flow reducing 75%
mid basilar stenosis.

Potentially flow reducing stenoses of the RIGHT internal carotid
artery, RIGHT anterior cerebral artery, functionally occluded LEFT
A1 ACA, both posterior cerebral arteries, and LEFT M2 MCA.

## 2016-09-08 IMAGING — MR MR HEAD W/O CM
9 of 10 series · 39 of 48 positions shown · non-contrast
Comparison: None.

CLINICAL DATA: Patient woke up today feeling dizzy. History of
hypertension. History of diabetes.

EXAM:
MRI HEAD WITHOUT CONTRAST
MRA HEAD WITHOUT CONTRAST
TECHNIQUE: Multiplanar, multiecho pulse sequences of the brain and surrounding
structures were obtained without intravenous contrast. Angiographic
images of the head were obtained using MRA technique without
contrast.

[Series 2: T1 · sagittal · 5.0mm · 0.45mm/px · 4 of 27 slices shown]
[im 1/27]
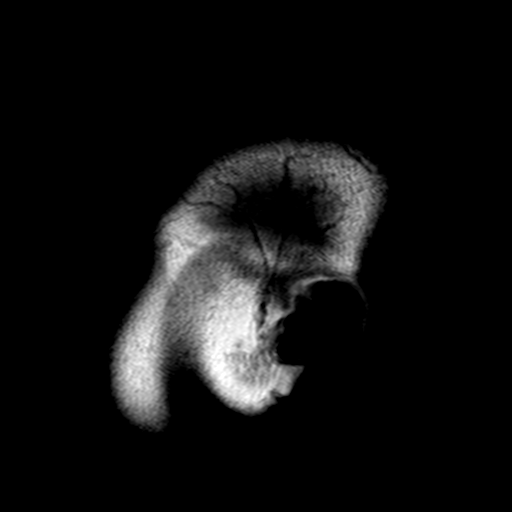
[im 7/27]
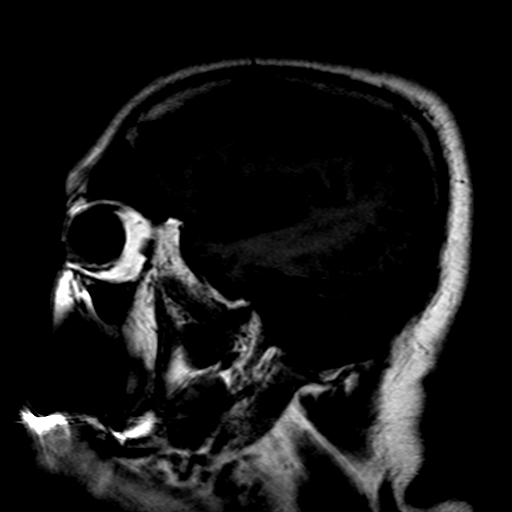
[im 14/27]
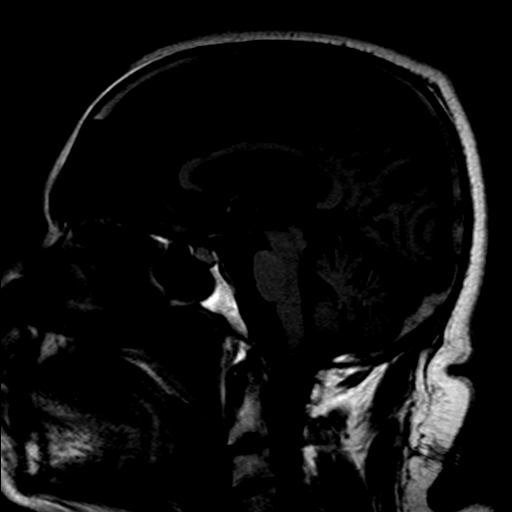
[im 20/27]
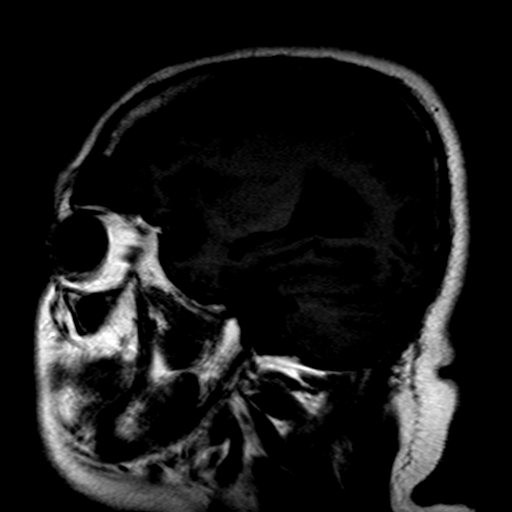

[Series 7: T2 · axial · 5.0mm · 0.45mm/px · z∈[-78,+78]mm · 3 of 25 slices shown (1 of 3)]
[im 1/25]
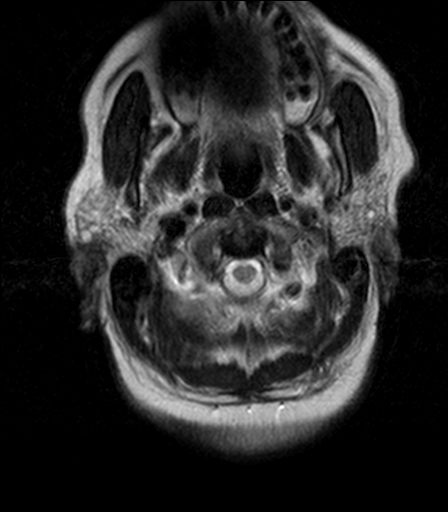
[im 13/25]
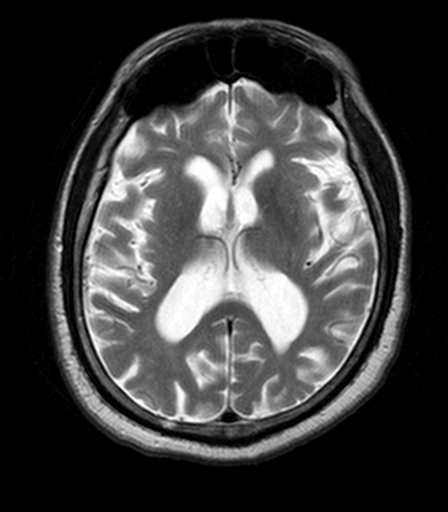
[im 25/25]
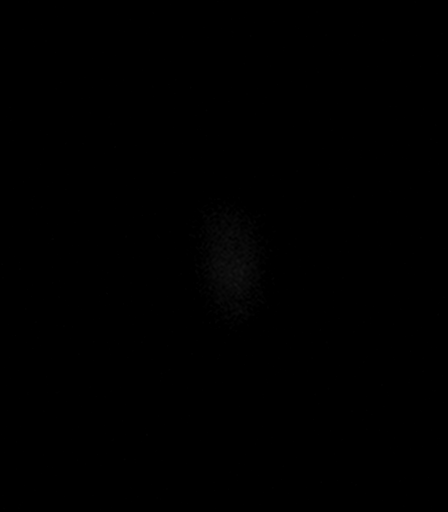

[Series 8: FLAIR · axial · 5.0mm · 0.90mm/px · z∈[-79,+77]mm · 3 of 25 slices shown]
[im 1/25]
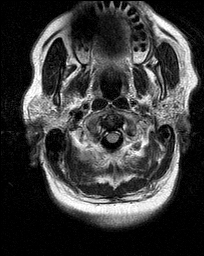
[im 13/25]
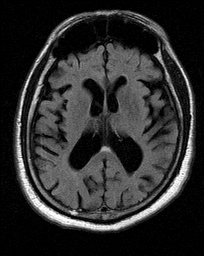
[im 25/25]
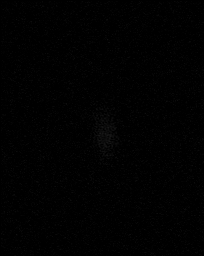

[Series 9: T2 · axial · 5.0mm · 0.45mm/px · z∈[-78,+78]mm · 3 of 25 slices shown (2 of 3)]
[im 1/25]
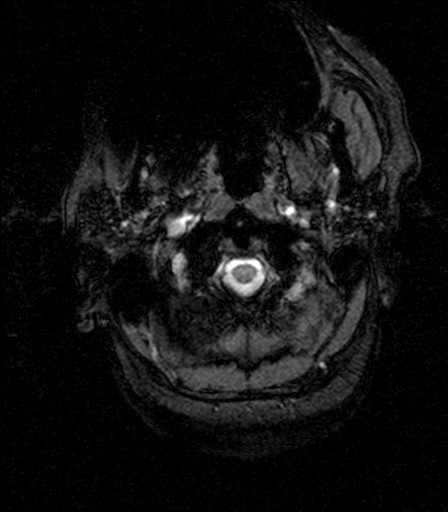
[im 13/25]
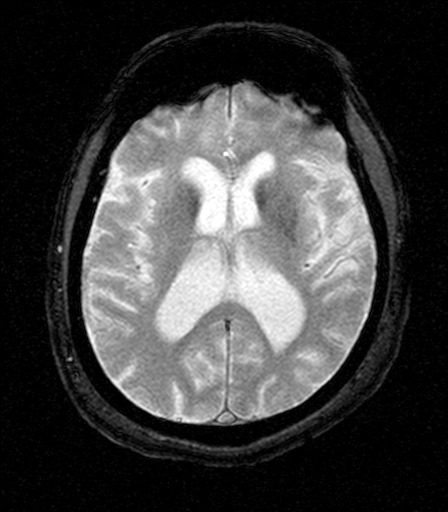
[im 25/25]
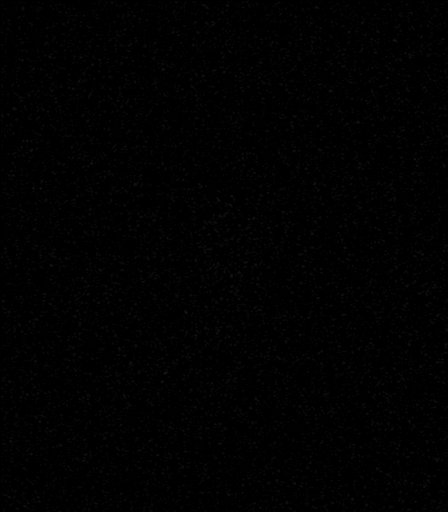

[Series 11: T2 · coronal · 5.0mm · 0.45mm/px · 4 of 31 slices shown (3 of 3)]
[im 1/31]
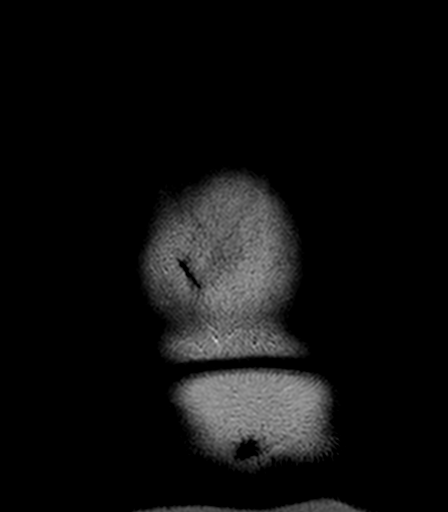
[im 11/31]
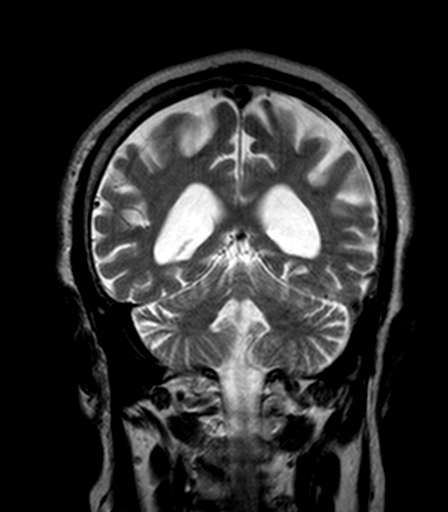
[im 21/31]
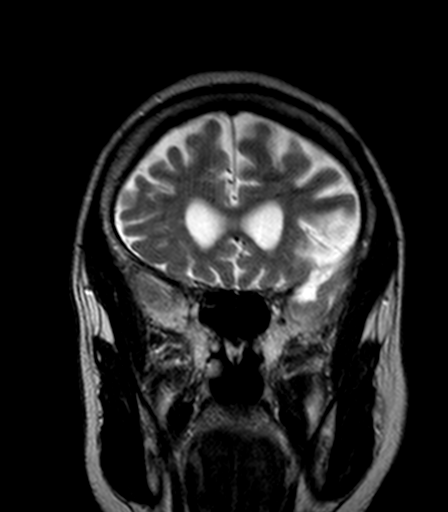
[im 31/31]
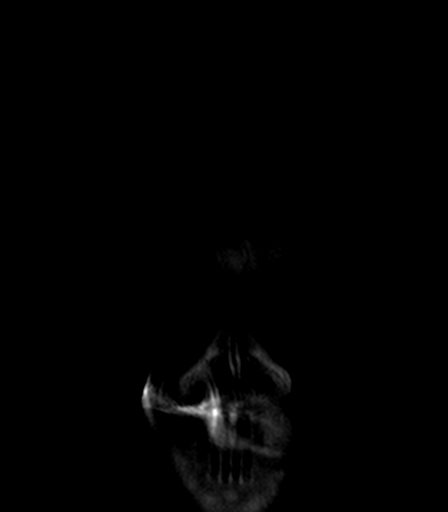

[Series 13: DWI · coronal · 5.0mm · 1.80mm/px · 5 of 39 slices shown (1 of 4)]
[im 1/39]
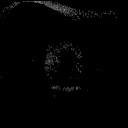
[im 10/39]
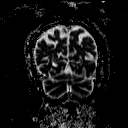
[im 20/39]
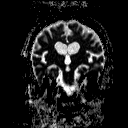
[im 29/39]
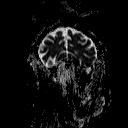
[im 39/39]
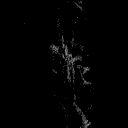

[Series 14: DWI · coronal · 5.0mm · 1.80mm/px · 5 of 35 slices shown (2 of 4)]
[im 1/35]
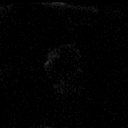
[im 9/35]
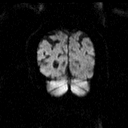
[im 18/35]
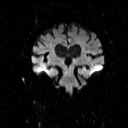
[im 26/35]
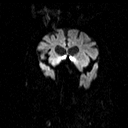
[im 35/35]
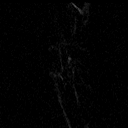

[Series 16: DWI · axial · 4.0mm · 0.94mm/px · z∈[-94,+70]mm · 6 of 42 slices shown (3 of 4)]
[im 1/42]
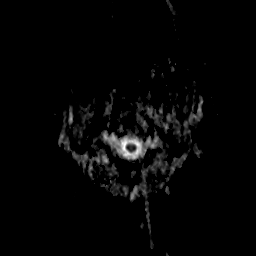
[im 9/42]
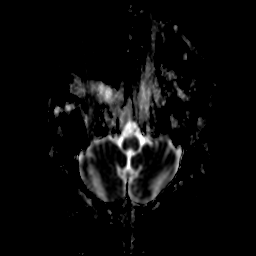
[im 17/42]
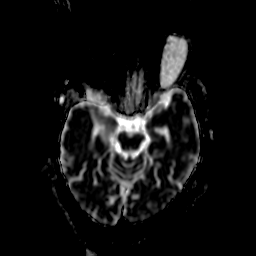
[im 25/42]
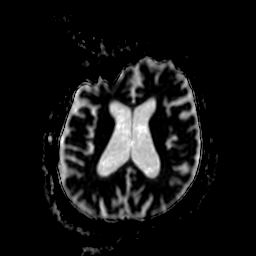
[im 33/42]
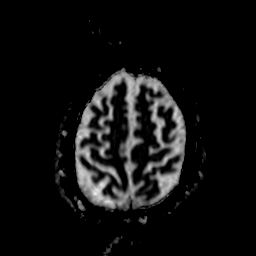
[im 42/42]
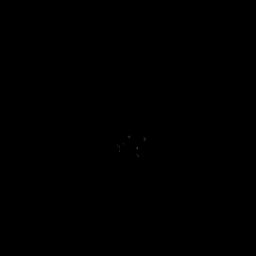

[Series 17: DWI · axial · 4.0mm · 0.94mm/px · z∈[-94,+66]mm · 6 of 40 slices shown (4 of 4)]
[im 1/40]
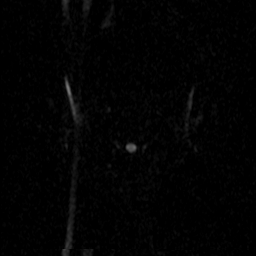
[im 8/40]
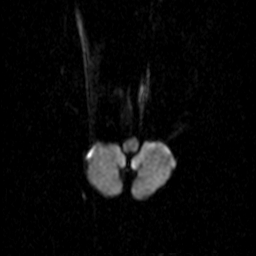
[im 16/40]
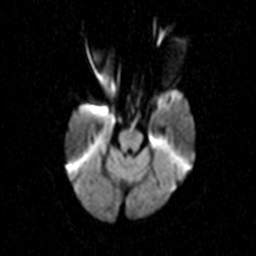
[im 24/40]
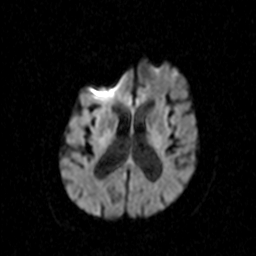
[im 32/40]
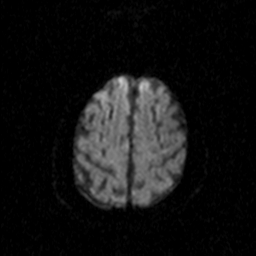
[im 40/40]
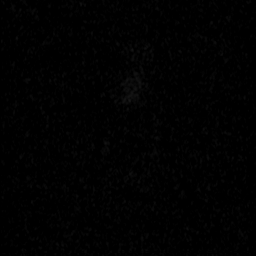

[39 of 48 positions shown; findings below may reference images not displayed]

FINDINGS: MRI HEAD FINDINGS

No acute stroke is evident.

There is no visible hemorrhage, mass lesion, or extra-axial fluid.
Generalized atrophy with hydrocephalus ex vacuo. Minor white matter
disease, nonspecific.

Flow voids are maintained in the BILATERAL internal carotid
arteries, basilar artery, and RIGHT vertebral artery. The LEFT
vertebral is thrombosed. This is of indeterminate age. I am unable
to assess whether this is an acute finding related to the patient's
acute vertigo, or is chronically occluded.

Pituitary and cerebellar tonsils unremarkable. Mild spondylosis. No
acute sinus or mastoid disease. Visualized orbits are negative.

MRA HEAD FINDINGS

RIGHT internal carotid artery: Widely patent cervical and petrous
segments. 75% stenosis inferior cavernous segment. Superior
cavernous, supraclinoid and ICA terminus all widely patent.

LEFT internal carotid artery: Non stenotic atheromatous change at
the cavernous supraclinoid junction. Otherwise widely patent.

RIGHT vertebral: Widely patent.

LEFT vertebral:  Thrombosed.

Basilar: Severely diseased basilar, moderately irregular in its
proximal, mid, and distal segments with a focal mid basilar 75%
stenosis, potentially flow reducing.

Anterior circulation: Severely diseased A1 origin on the RIGHT.
Functionally occluded LEFT A1 ACA. Both anterior cerebral arteries
fill from the RIGHT but are at risk due to the high-grade proximal
stenosis. Mildly irregular RIGHT and LEFT M1 MCA. Severely diseased
inferior branch LEFT M2 MCA. Mildly irregular distal M3 branches
bilaterally.

Posterior circulation: Severely diseased RIGHT and LEFT P1 segments
of the posterior cerebral arteries, estimated 75-90% stenoses.
Unremarkable BILATERAL superior cerebellar arteries. Dominant RIGHT
anterior inferior cerebral artery, likely AICA/PICA trunk. With No
similar findings on the LEFT.

No intracranial aneurysm.
IMPRESSION: No acute stroke.

Generalized atrophy.

Age indeterminate LEFT vertebral thrombosis; this could be acute or
chronic but there is no acute posterior circulation infarct
observed.

Severely diseased basilar artery with potentially flow reducing 75%
mid basilar stenosis.

Potentially flow reducing stenoses of the RIGHT internal carotid
artery, RIGHT anterior cerebral artery, functionally occluded LEFT
A1 ACA, both posterior cerebral arteries, and LEFT M2 MCA.

## 2019-05-19 ENCOUNTER — Other Ambulatory Visit: Payer: Self-pay

## 2021-06-17 ENCOUNTER — Other Ambulatory Visit (INDEPENDENT_AMBULATORY_CARE_PROVIDER_SITE_OTHER): Payer: Self-pay | Admitting: Vascular Surgery

## 2021-06-17 DIAGNOSIS — I6523 Occlusion and stenosis of bilateral carotid arteries: Secondary | ICD-10-CM

## 2021-06-19 ENCOUNTER — Ambulatory Visit (INDEPENDENT_AMBULATORY_CARE_PROVIDER_SITE_OTHER): Payer: Medicare Other | Admitting: Vascular Surgery

## 2021-06-19 ENCOUNTER — Ambulatory Visit (INDEPENDENT_AMBULATORY_CARE_PROVIDER_SITE_OTHER): Payer: Medicare Other

## 2021-06-19 ENCOUNTER — Encounter (INDEPENDENT_AMBULATORY_CARE_PROVIDER_SITE_OTHER): Payer: Self-pay | Admitting: Vascular Surgery

## 2021-06-19 VITALS — BP 137/74 | HR 68 | Resp 17 | Ht 68.0 in | Wt 167.0 lb

## 2021-06-19 DIAGNOSIS — E1159 Type 2 diabetes mellitus with other circulatory complications: Secondary | ICD-10-CM

## 2021-06-19 DIAGNOSIS — I10 Essential (primary) hypertension: Secondary | ICD-10-CM | POA: Diagnosis not present

## 2021-06-19 DIAGNOSIS — E782 Mixed hyperlipidemia: Secondary | ICD-10-CM

## 2021-06-19 DIAGNOSIS — I6523 Occlusion and stenosis of bilateral carotid arteries: Secondary | ICD-10-CM

## 2021-06-19 NOTE — Progress Notes (Signed)
MRN : [id number]  Jonathan Christensen is a 88 y.o. (03-16-1933) male who presents with chief complaint of check carotid arteries.  History of Present Illness:   The patient is seen for evaluation of carotid stenosis.   The patient denies amaurosis fugax. There is no recent history of TIA symptoms or focal motor deficits. There is no prior documented CVA.  There is no history of migraine headaches. There is no history of seizures.  The patient is taking enteric-coated aspirin 81 mg daily.  No recent shortening of the patient's walking distance or new symptoms consistent with claudication.  No history of rest pain symptoms. No new ulcers or wounds of the lower extremities have occurred.  There is no history of DVT, PE or superficial thrombophlebitis. No recent episodes of angina or shortness of breath documented.   Carotid duplex done today shows RICA 1-39% and LICA 1-39% (no change compared to study 06/11/2016)   Current Meds  Medication Sig   amLODipine-benazepril (LOTREL) 10-20 MG capsule    aspirin EC 81 MG tablet Take 81 mg by mouth daily.   atorvastatin (LIPITOR) 10 MG tablet    clopidogrel (PLAVIX) 75 MG tablet    hydrochlorothiazide (HYDRODIURIL) 12.5 MG tablet Take 12.5 mg by mouth daily.   metFORMIN (GLUCOPHAGE) 500 MG tablet    metoprolol succinate (TOPROL-XL) 100 MG 24 hr tablet Take 100 mg by mouth daily.   mometasone (NASONEX) 50 MCG/ACT nasal spray Place 2 sprays into the nose daily as needed. For allergies.   tamsulosin (FLOMAX) 0.4 MG CAPS capsule Take 0.4 mg by mouth daily.    Past Medical History:  Diagnosis Date   Diabetes mellitus without complication (HCC)    type 2   Hypertension     Past Surgical History:  Procedure Laterality Date   APPENDECTOMY      Social History Social History   Tobacco Use   Smoking status: Former   Smokeless tobacco: Never  Substance Use Topics   Alcohol use: Yes    Comment: occasionally   Drug use: No    Family  History No family history on file.  No Known Allergies   REVIEW OF SYSTEMS (Negative unless checked)  Constitutional: [] Weight loss  [] Fever  [] Chills Cardiac: [] Chest pain   [] Chest pressure   [] Palpitations   [] Shortness of breath when laying flat   [] Shortness of breath with exertion. Vascular:  [x] Pain in legs with walking   [] Pain in legs at rest  [] History of DVT   [] Phlebitis   [] Swelling in legs   [] Varicose veins   [] Non-healing ulcers Pulmonary:   [] Uses home oxygen   [] Productive cough   [] Hemoptysis   [] Wheeze  [] COPD   [] Asthma Neurologic:  [] Dizziness   [] Seizures   [] History of stroke   [] History of TIA  [] Aphasia   [] Vissual changes   [] Weakness or numbness in arm   [] Weakness or numbness in leg Musculoskeletal:   [] Joint swelling   [] Joint pain   [] Low back pain Hematologic:  [] Easy bruising  [] Easy bleeding   [] Hypercoagulable state   [] Anemic Gastrointestinal:  [] Diarrhea   [] Vomiting  [] Gastroesophageal reflux/heartburn   [] Difficulty swallowing. Genitourinary:  [] Chronic kidney disease   [] Difficult urination  [] Frequent urination   [] Blood in urine Skin:  [] Rashes   [] Ulcers  Psychological:  [] History of anxiety   []  History of major depression.  Physical Examination  Vitals:   06/19/21 0947  BP: 137/74  Pulse: 68  Resp: 17  Weight:  167 lb (75.8 kg)  Height: 5\' 8"  (1.727 m)   Body mass index is 25.39 kg/m. Gen: WD/WN, NAD Head: [place]/AT, No temporalis wasting.  Ear/Nose/Throat: Hearing grossly intact, nares w/o erythema or drainage Eyes: PER, EOMI, sclera nonicteric.  Neck: Supple, no masses.  No bruit or JVD.  Pulmonary:  Good air movement, no audible wheezing, no use of accessory muscles.  Cardiac: RRR, normal S1, S2, no Murmurs. Vascular:  carotid bruit noted Vessel Right Left  Radial Palpable Palpable  Carotid  Palpable  Palpable  Gastrointestinal: soft, non-distended. No guarding/no peritoneal signs.  Musculoskeletal: M/S 5/5 throughout.  No visible  deformity.  Neurologic: CN 2-12 intact. Pain and light touch intact in extremities.  Symmetrical.  Speech is fluent. Motor exam as listed above. Psychiatric: Judgment intact, Mood & affect appropriate for pt's clinical situation. Dermatologic: No rashes or ulcers noted.  No changes consistent with cellulitis.   CBC Lab Results  Component Value Date   WBC 4.4 12/01/2014   HGB 13.3 12/01/2014   HCT 41.2 12/01/2014   MCV 85.8 12/01/2014   PLT 215 12/01/2014    BMET    Component Value Date/Time   NA 140 12/01/2014 0723   K 3.9 12/01/2014 0723   CL 106 12/01/2014 0723   CO2 26 12/01/2014 0723   GLUCOSE 183 (H) 12/01/2014 0723   BUN 11 12/01/2014 0723   CREATININE 1.04 12/01/2014 0723   CALCIUM 9.2 12/01/2014 0723   GFRNONAA >60 12/01/2014 0723   GFRAA >60 12/01/2014 0723   CrCl cannot be calculated (Patient's most recent lab result is older than the maximum 21 days allowed.).  COAG Lab Results  Component Value Date   INR 0.95 12/01/2014    Radiology No results found.   Assessment/Plan 1. Bilateral carotid artery stenosis Recommend:  Given the patient's asymptomatic subcritical stenosis no further invasive testing or surgery at this time.  Duplex ultrasound shows <30% stenosis bilaterally which has been unchanged when compared to the previous studies.  Continue antiplatelet therapy as prescribed Continue management of CAD, HTN and Hyperlipidemia Healthy heart diet,  encouraged exercise at least 4 times per week  Given the stable <30% bilateral carotid stenosis (which is stable over the past 5 years) in association with the patient's age the patient will follow up PRN.  The patient is told that if symptoms of a TIA should occur then he should go to the ER and I should be notified, as this would change the management course.  The patient voices understanding.   He would like to follow up with his cardiologist for this issue  2. Essential hypertension Continue  antihypertensive medications as already ordered, these medications have been reviewed and there are no changes at this time.   3. Type 2 diabetes mellitus with other circulatory complication, without long-term current use of insulin (HCC) Continue hypoglycemic medications as already ordered, these medications have been reviewed and there are no changes at this time.  Hgb A1C to be monitored as already arranged by primary service   4. Mixed hyperlipidemia Continue statin as ordered and reviewed, no changes at this time     12/03/2014, MD  06/19/2021 10:27 AM

## 2021-06-22 ENCOUNTER — Encounter (INDEPENDENT_AMBULATORY_CARE_PROVIDER_SITE_OTHER): Payer: Self-pay | Admitting: Vascular Surgery

## 2022-11-16 ENCOUNTER — Other Ambulatory Visit: Payer: Self-pay | Admitting: Internal Medicine

## 2022-11-16 DIAGNOSIS — I10 Essential (primary) hypertension: Secondary | ICD-10-CM

## 2022-11-30 ENCOUNTER — Inpatient Hospital Stay: Admission: RE | Admit: 2022-11-30 | Payer: Medicare Other | Source: Ambulatory Visit
